# Patient Record
Sex: Female | Born: 1965 | Race: White | Hispanic: No | Marital: Married | Smoking: Never smoker
Health system: Southern US, Community
[De-identification: ages and names within clinical notes are randomized; demographics above are authoritative.]

## PROBLEM LIST (undated history)

## (undated) DIAGNOSIS — F319 Bipolar disorder, unspecified: Secondary | ICD-10-CM

## (undated) DIAGNOSIS — K769 Liver disease, unspecified: Secondary | ICD-10-CM

## (undated) DIAGNOSIS — F32A Depression, unspecified: Secondary | ICD-10-CM

## (undated) DIAGNOSIS — K219 Gastro-esophageal reflux disease without esophagitis: Secondary | ICD-10-CM

## (undated) DIAGNOSIS — F419 Anxiety disorder, unspecified: Secondary | ICD-10-CM

## (undated) DIAGNOSIS — T7840XA Allergy, unspecified, initial encounter: Secondary | ICD-10-CM

## (undated) DIAGNOSIS — E7800 Pure hypercholesterolemia, unspecified: Secondary | ICD-10-CM

## (undated) DIAGNOSIS — E119 Type 2 diabetes mellitus without complications: Secondary | ICD-10-CM

## (undated) DIAGNOSIS — N189 Chronic kidney disease, unspecified: Secondary | ICD-10-CM

## (undated) DIAGNOSIS — I10 Essential (primary) hypertension: Secondary | ICD-10-CM

## (undated) DIAGNOSIS — M1990 Unspecified osteoarthritis, unspecified site: Secondary | ICD-10-CM

## (undated) HISTORY — DX: Liver disease, unspecified: K76.9

## (undated) HISTORY — DX: Allergy, unspecified, initial encounter: T78.40XA

## (undated) HISTORY — DX: Type 2 diabetes mellitus without complications: E11.9

## (undated) HISTORY — DX: Pure hypercholesterolemia, unspecified: E78.00

## (undated) HISTORY — DX: Unspecified osteoarthritis, unspecified site: M19.90

## (undated) HISTORY — DX: Essential (primary) hypertension: I10

---

## 1987-01-14 HISTORY — PX: TUBAL LIGATION: SHX77

## 1988-01-14 HISTORY — PX: CHOLECYSTECTOMY: SHX55

## 2007-01-14 HISTORY — PX: KNEE ARTHROSCOPY: SUR90

## 2007-04-12 ENCOUNTER — Ambulatory Visit: Payer: Self-pay | Admitting: Cardiology

## 2007-04-15 ENCOUNTER — Ambulatory Visit: Payer: Self-pay

## 2007-04-19 ENCOUNTER — Ambulatory Visit: Payer: Self-pay

## 2007-04-21 ENCOUNTER — Ambulatory Visit (HOSPITAL_BASED_OUTPATIENT_CLINIC_OR_DEPARTMENT_OTHER): Admission: RE | Admit: 2007-04-21 | Discharge: 2007-04-21 | Payer: Self-pay | Admitting: Cardiology

## 2007-04-27 ENCOUNTER — Ambulatory Visit: Payer: Self-pay | Admitting: Pulmonary Disease

## 2007-06-03 ENCOUNTER — Ambulatory Visit: Payer: Self-pay | Admitting: Cardiology

## 2007-06-08 ENCOUNTER — Ambulatory Visit: Payer: Self-pay | Admitting: Pulmonary Disease

## 2007-06-08 DIAGNOSIS — F518 Other sleep disorders not due to a substance or known physiological condition: Secondary | ICD-10-CM

## 2007-06-08 DIAGNOSIS — J329 Chronic sinusitis, unspecified: Secondary | ICD-10-CM | POA: Insufficient documentation

## 2007-06-08 DIAGNOSIS — G4733 Obstructive sleep apnea (adult) (pediatric): Secondary | ICD-10-CM

## 2007-06-08 DIAGNOSIS — Z86718 Personal history of other venous thrombosis and embolism: Secondary | ICD-10-CM

## 2009-01-30 ENCOUNTER — Emergency Department (HOSPITAL_COMMUNITY): Admission: EM | Admit: 2009-01-30 | Discharge: 2009-01-31 | Payer: Self-pay | Admitting: Emergency Medicine

## 2009-01-31 ENCOUNTER — Inpatient Hospital Stay (HOSPITAL_COMMUNITY): Admission: AD | Admit: 2009-01-31 | Discharge: 2009-02-05 | Payer: Self-pay | Admitting: Psychiatry

## 2009-01-31 ENCOUNTER — Ambulatory Visit: Payer: Self-pay | Admitting: Psychiatry

## 2009-11-27 ENCOUNTER — Emergency Department (HOSPITAL_COMMUNITY): Admission: EM | Admit: 2009-11-27 | Discharge: 2009-11-27 | Payer: Self-pay | Admitting: Family Medicine

## 2010-02-04 ENCOUNTER — Emergency Department (HOSPITAL_COMMUNITY)
Admission: EM | Admit: 2010-02-04 | Discharge: 2010-02-04 | Payer: Self-pay | Source: Home / Self Care | Admitting: Emergency Medicine

## 2010-02-05 LAB — URINALYSIS, ROUTINE W REFLEX MICROSCOPIC
Bilirubin Urine: NEGATIVE
Hgb urine dipstick: NEGATIVE
Protein, ur: NEGATIVE mg/dL
Specific Gravity, Urine: 1.021 (ref 1.005–1.030)
Urine Glucose, Fasting: NEGATIVE mg/dL
Urobilinogen, UA: 0.2 mg/dL (ref 0.0–1.0)

## 2010-02-05 LAB — URINE MICROSCOPIC-ADD ON

## 2010-03-26 LAB — POCT RAPID STREP A (OFFICE): Streptococcus, Group A Screen (Direct): NEGATIVE

## 2010-04-01 LAB — DIFFERENTIAL
Basophils Absolute: 0 10*3/uL (ref 0.0–0.1)
Eosinophils Relative: 1 % (ref 0–5)
Monocytes Absolute: 0.3 10*3/uL (ref 0.1–1.0)
Monocytes Relative: 3 % (ref 3–12)
Neutrophils Relative %: 79 % — ABNORMAL HIGH (ref 43–77)

## 2010-04-01 LAB — URINALYSIS, ROUTINE W REFLEX MICROSCOPIC
Bilirubin Urine: NEGATIVE
Hgb urine dipstick: NEGATIVE
Specific Gravity, Urine: 1.01 (ref 1.005–1.030)
pH: 7 (ref 5.0–8.0)

## 2010-04-01 LAB — POCT PREGNANCY, URINE: Preg Test, Ur: NEGATIVE

## 2010-04-01 LAB — COMPREHENSIVE METABOLIC PANEL
ALT: 15 U/L (ref 0–35)
CO2: 26 mEq/L (ref 19–32)
Creatinine, Ser: 0.67 mg/dL (ref 0.4–1.2)
GFR calc non Af Amer: >60 mL/min (ref >60)
Total Bilirubin: 0.5 mg/dL (ref 0.3–1.2)
Total Protein: 7.6 g/dL (ref 6.0–8.3)

## 2010-04-01 LAB — RAPID URINE DRUG SCREEN, HOSP PERFORMED
Benzodiazepines: NOT DETECTED
Cocaine: NOT DETECTED
Opiates: NOT DETECTED

## 2010-04-01 LAB — ACETAMINOPHEN LEVEL: Acetaminophen (Tylenol), Serum: <10 ug/mL — ABNORMAL LOW (ref 10–30)

## 2010-04-01 LAB — CBC
HCT: 35.8 % — ABNORMAL LOW (ref 36.0–46.0)
Hemoglobin: 12.1 g/dL (ref 12.0–15.0)
MCHC: 33.7 g/dL (ref 30.0–36.0)
RBC: 4.18 MIL/uL (ref 3.87–5.11)
RDW: 12.9 % (ref 11.5–15.5)
WBC: 10.4 10*3/uL (ref 4.0–10.5)

## 2010-04-01 LAB — SALICYLATE LEVEL: Salicylate Lvl: <4 mg/dL (ref 2.8–20.0)

## 2010-04-01 LAB — ETHANOL: Alcohol, Ethyl (B): <5 mg/dL (ref 0–10)

## 2010-04-03 ENCOUNTER — Other Ambulatory Visit (HOSPITAL_COMMUNITY)
Admission: RE | Admit: 2010-04-03 | Discharge: 2010-04-03 | Disposition: A | Discharge status: Home / Self Care | Payer: Self-pay | Source: Ambulatory Visit | Attending: Family Medicine | Admitting: Family Medicine

## 2010-04-03 DIAGNOSIS — Z124 Encounter for screening for malignant neoplasm of cervix: Secondary | ICD-10-CM | POA: Insufficient documentation

## 2010-05-28 NOTE — Assessment & Plan Note (Signed)
[facility]                            CARDIOLOGY OFFICE NOTE   NAME:WRIGHTNocole, Zammit                         MRN:          [id number]  DATE:06/03/2007                            DOB:          [birth year]    PRIMARY:  Dorie Rank, PA, [facility], [address]   REASON FOR PRESENTATION:  Evaluate patient with chest pain.   HISTORY OF PRESENT ILLNESS:  The patient is a pleasant 45 year old who  presents for followup of the above.  Her symptoms are described  extensively in the March 30 note.  She was sent for stress perfusion  study which demonstrated an EF of 65% with no ischemia or infarct.  She  has since done well.  She is having her depression treated, starting  fluoxetine and trazodone.  She did have a sleep study demonstrating some  mild sleep apnea and she is due to see one of the sleep doctors in the  next few days.   She is still getting some fleeting chest discomfort, but she has been  doing [employer]ves now.  She with that is not getting any chest discomfort.  She has had some mild knee pain.  She is having shortness of breath  related to activity, but this seems to be appropriate for size and level  of deconditioning.  She is not having any resting shortness breath.  She  denies any PND or orthopnea.   PAST MEDICAL HISTORY:  1. Depression for 12 years.  2. Knee surgery.  3. Cholecystectomy.  4. Tubal ligation.  5. Probable mild sleep apnea.   ALLERGIES:  None.   MEDICATIONS:  1. Trazodone 50 mg nightly.  2. Fluoxetine 20 mg daily.  3. Multivitamin.   REVIEW OF SYSTEMS:  As stated in HPI and otherwise negative for other  systems.   PHYSICAL EXAMINATION:  The patient is in no distress.  Blood pressure 114/77, heart rate 88 and regular, weight 266 pounds  (down 13 pounds since the last visit).  NECK:  No jugular distention at 45 degrees; carotid upstroke brisk and  symmetrical; no bruits; no thyromegaly.  LYMPHATICS:  No  adenopathy.  LUNGS:  Clear to auscultation bilaterally.  BACK:  No costovertebral angle tenderness.  CHEST:  Unremarkable.  HEART:  PMI not displaced or sustained, S1 and S2 within normal, no S3,  no S4, no clicks, no rubs, no murmurs.  ABDOMEN:  Obese; positive bowel  sounds, normal in frequency and pitch; no bruits, no rebound, no  guarding, no midline pulsatile mass, no hepatomegaly, no splenomegaly.  SKIN:  No rashes, no nodules.  EXTREMITIES:  Pulses 2+, no edema.   ASSESSMENT AND PLAN:  1. Chest discomfort.  The patient has had still some infrequent chest      discomfort, but a negative stress perfusion study.  The possibility      of this being cardiac is very low.  No further cardiovascular      testing is suggested.  2. Sleep apnea.  She has an appointment with the sleep doctor.  3. Depression.  I am delighted this is  being treated and she says she      is doing much better with the fluoxetine.  4. Obesity.  She has lost weight.  She has joined Curves and I applaud      and encourage more of this.  5. Followup:  I will see the patient back as needed.     Rollene Rotunda, MD, [facility]  Electronically Signed    JH/MedQ  DD: 06/03/2007  DT: 06/03/2007  Job #: [id number]   cc:   Dorie Rank, P.A.

## 2010-05-28 NOTE — Assessment & Plan Note (Signed)
[facility]                            CARDIOLOGY OFFICE NOTE   NAME:Parrish, Sara                         MRN:          [id number]  DATE:04/12/2007                            DOB:          Jan 17, 1965    REFERRING:  Dorie Rank, MD, [facility], [facility] [address].   REASON FOR PRESENTATION:  Evaluate patient with chest pain.   HISTORY OF PRESENT ILLNESS:  The patient is a pleasant 45 year old with  no prior cardiac history.  She had chest discomfort on March 19.  This  happened after emotional stress.  She described it as a substernal left-  sided discomfort.  There was some in her axilla as well.  It was chest  tightening with a stabbing discomfort.  It lasted from 1:30 to 3:30.  She did present to [facility].  She had a chest CT, which she reports  demonstrated no pulmonary embolism (I do not have this report).  The  discomfort slowly eased over a couple of hours.  However, she still gets  it on and off with emotional stress or activity such as walking across  the parking lot at work.  She has no associated nausea or vomiting.  She  does get some shortness of breath with this.  She has not had any  diaphoresis.  She is not noticing it at rest.  She chronically sleeps on  three pillows because of difficulty breathing.  This is not a new  problem.  Her husband says she snores quite a bit.  She has been under  quite a bit of stress.  She is very tearful in the office.  She cares  for an ailing mother and a son with some disabilities.  She is being now  counseled for depression and is soon to see a new primary care doctor  possibly to start antidepressants.   PAST MEDICAL HISTORY:  Depression x12 years.  The patient has no history  of hyperlipidemia (she said she was at a health fair and told it was  borderline with a low HDL recently), she has no history of hypertension  (she does not check it frequently), she has not been told she had  diabetes.   PAST SURGICAL HISTORY:  Knee surgery, cholecystectomy, tubal ligation.   ALLERGIES:  None.   CURRENT MEDICATIONS:  Alprazolam p.r.n., Prilosec.   SOCIAL HISTORY:  The patient works in [employer].  She is married.  She has two children and two grandchildren.  She never smoked cigarettes  and does not drink alcohol.   FAMILY HISTORY:  Noncontributory for early coronary artery disease,  though there his a strong history of diabetes.   REVIEW OF SYSTEMS:  As stated in the HPI.  Positive for reflux not  similar to the current pain, joint pains.  Negative for other systems.   PHYSICAL EXAMINATION:  The patient is in no distress.  Blood pressure 155/96, heart rate 112 and regular, weight 279 pounds.  HEENT:  Eyelids unremarkable.  Pupils equal, round and reactive to  light, fundi within normal limits.  Oral mucosa unremarkable.  Tongue  notching.  NECK:  No jugular venous distention at 45 degrees.  Carotid upstroke  brisk and symmetrical.  No bruits, no thyromegaly.  LYMPHATICS:  No cervical, axillary or inguinal adenopathy.  LUNGS:  Clear to auscultation bilaterally.  BACK:  No costovertebral angle tenderness.  CHEST:  Unremarkable.  HEART:  PMI not displaced or sustained, S1 and S2 within normal limits,  no S3, no S4, no clicks, no rubs, no murmurs.  ABDOMEN:  Obese, positive bowel sounds, normal in frequency and pitch,  no bruits, no rebound, no guarding, no midline pulsatile mass, no  hepatomegaly, no splenomegaly.  SKIN:  No rashes, no nodules.  EXTREMITIES:  2+ pulses throughout, no edema, no cyanosis, no clubbing.  NEUROLOGIC:  Oriented to person, place and time.  Cranial nerves II-XII  grossly intact.  Motor grossly intact.   EKG (done at [facility] office):  Sinus rhythm, rate 100, axis within  normal limits, intervals within normal limits, no acute ST-wave changes.   ASSESSMENT/PLAN:  1. Chest discomfort:  The patient's chest discomfort  does have some      worrisome  features for unstable angina.  It is new-onset exertional      angina.  The pretest probability is at least moderate for      obstructive coronary artery disease.  Therefore, she needs stress      perfusion imaging.  I discussed this with her and she agrees to      proceed.  She should have this before she goes back to work.      Further evaluation will be based on these results.  2. Sleep apnea:  The patient almost definitely has sleep apnea.  She      has tongue notching, notching loud snoring and the body habitus.      She has severe fatigue.  We will schedule her for a sleep study.  3. Hypertension:  Blood pressure is elevated, though she does not      think this is usual.  I am going to ask her to keep a blood      pressure diary and she can borrow her mother's cuff.  Certainly if      she has sleep apnea, treating this will improve her blood pressure.      Also she is interested a weight loss.  However, in the interim she      may need some medical management.  She will let me know results of      her blood pressure diary.  4. Obesity:  We discussed weight loss with diet and exercise.  She      will be cleared to exercise based on the stress test.  We discussed      the [employer].  5. Risk reduction:  The patient recently had lipids, and this can be      followed by her primary care doctor.  6. Depression:  This is a very active issue.  Certainly fatigue and      sleep apnea could contribute.  However, she is going to get a new      primary care doctor and continue      counseling.  Hopefully, she will get management of this as it is a      very important issue.  7. Follow-up:  I will see her back after the results of the above.     Rollene Rotunda, MD, [facility]  Electronically Signed    JH/MedQ  DD:  04/12/2007  DT: 04/12/2007  Job #: [id number]   cc:   Dorie Rank, MD

## 2010-05-28 NOTE — Procedures (Signed)
NAME:  Sara Parrish, Sara Parrish NO.:  [id number]   MEDICAL RECORD NO.:  [id number]          PATIENT TYPE:  OUT   LOCATION:  SLEEP CENTER                 FACILITY:  [facility]   PHYSICIAN:  Barbaraann Share, MD,FCCPDATE OF BIRTH:  03/06/1963   DATE OF STUDY:  04/21/2007                            NOCTURNAL POLYSOMNOGRAM   REFERRING PHYSICIAN:  Rollene Rotunda, MD, [facility]   REFERRING PHYSICIAN:  Rollene Rotunda, MD, [facility].   INDICATION FOR STUDY:  Hypersomnia with sleep apnea.   EPWORTH SLEEPINESS SCORE:  9.   MEDICATIONS:   SLEEP ARCHITECTURE:  The patient had total sleep time of 257 minutes  with no slow wave sleep and significantly decreased REM.  Sleep onset  latency was prolonged at 64 minutes and REM onset was prolonged at 90  minutes.  Sleep efficiency was significantly decreased at 67%.   RESPIRATORY DATA:  The patient was found to have no obstructive apneas  and 33 obstructive hypopneas for an apnea hypopnea index of 8 events per  hour.  The events did occur primarily on the patient's left side and  also during REM, and there was loud snoring noted throughout.  The  patient did not need split night protocol secondary to the small numbers  of events until well after 1:00 a.m.   OXYGEN DATA:  There was O2 desaturation as low as 86% with the patient's  obstructive events.   CARDIAC DATA:  No clinically significant arrhythmias were noted.   MOVEMENT-PARASOMNIA:  The patient was found to have no significant leg  jerks or abnormal behaviors.   IMPRESSIONS-RECOMMENDATIONS:  Mild obstructive sleep apnea/hypopnea  syndrome with an apnea hypopnea index of 8 events per hour and O2  desaturation as low as 86%.  It should be noted that the patient's  events occurred very commonly during REM, and the patient was noted to  have very little slow wave sleep or REM during  the night.  Therefore, her degree of sleep apnea may be under estimated.  The patient did not undergo split  night study secondary to the majority  of her events occurring well after 1:00 a.m.      Barbaraann Share, MD,FCCP  Diplomate, American Board of Sleep  Medicine  Electronically Signed     [facility]/MEDQ  D:  04/27/2007 15:58:42  T:  04/27/2007 16:27:34  Job:  [id number]

## 2012-03-09 IMAGING — CT CT ABD-PELV W/O CM
2 of 4 series · 14 of 32 positions shown, 19 images · non-contrast
Comparison: None.

CLINICAL DATA: Left flank pain/history of kidney stones

CT ABDOMEN AND PELVIS WITHOUT CONTRAST
TECHNIQUE: Multidetector CT imaging of the abdomen and pelvis was
performed following the standard protocol without intravenous
contrast.

[Series 2: renal stone · axial · 0.76mm/px · z∈[-498,-193]mm · 8 of 79 slices shown, 13 images]
[im 9/79  soft-tissue]
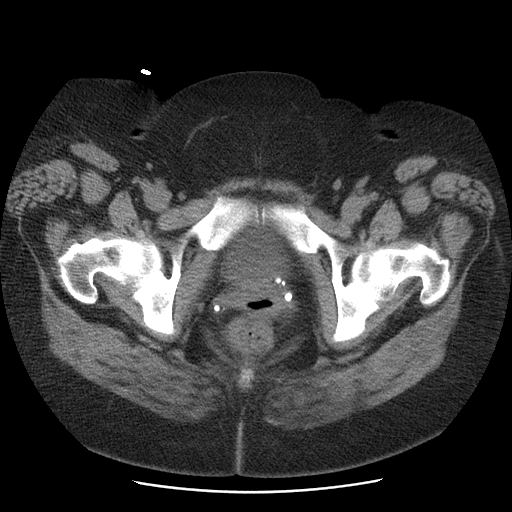
[im 9/79  bone]
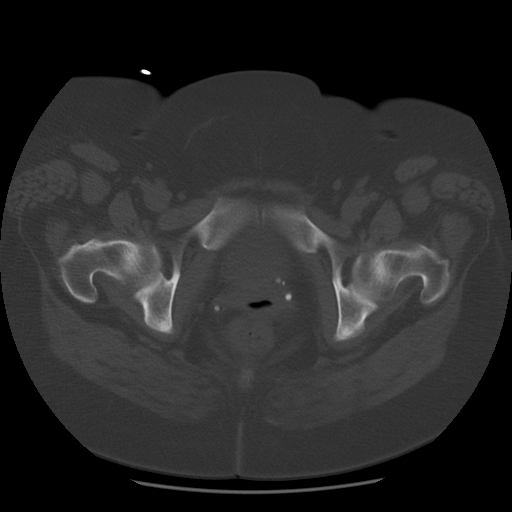
[im 18/79  soft-tissue]
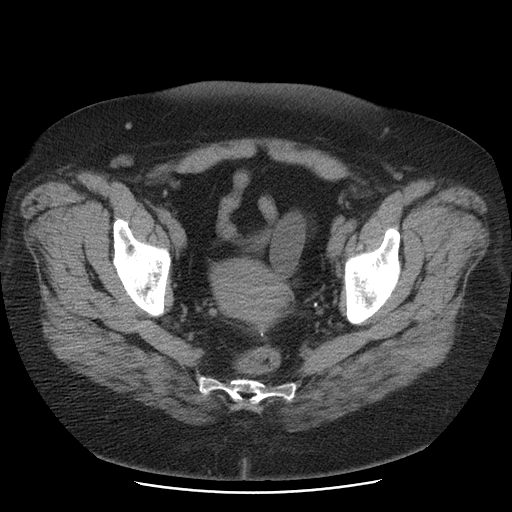
[im 27/79  soft-tissue]
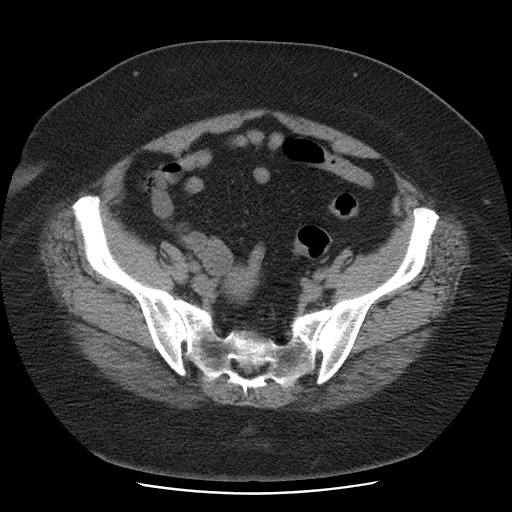
[im 35/79  soft-tissue]
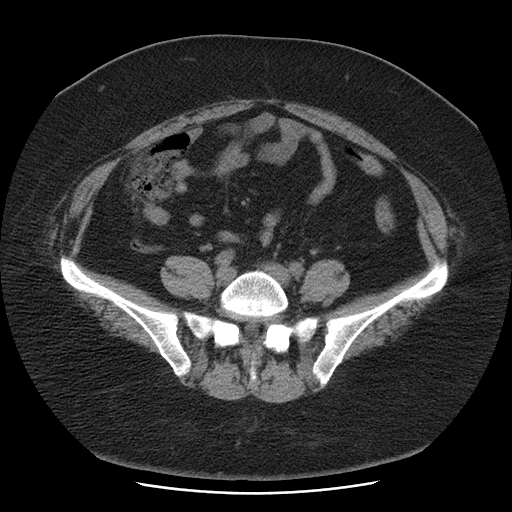
[im 44/79  soft-tissue]
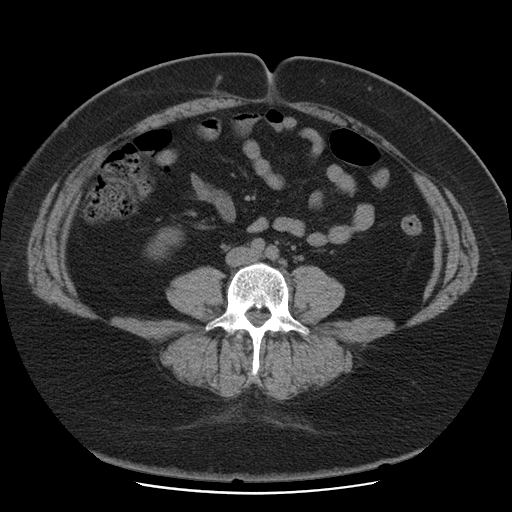
[im 44/79  lung]
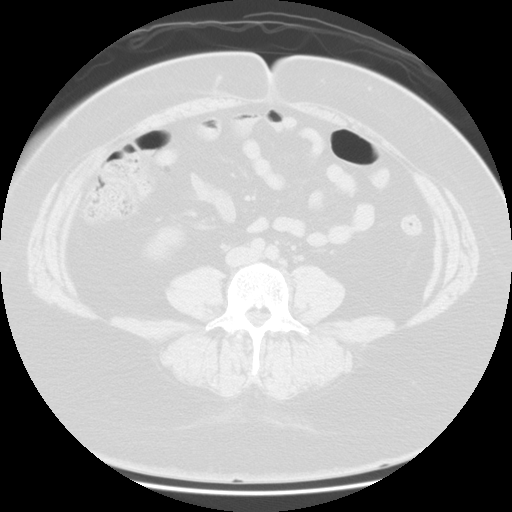
[im 53/79  soft-tissue]
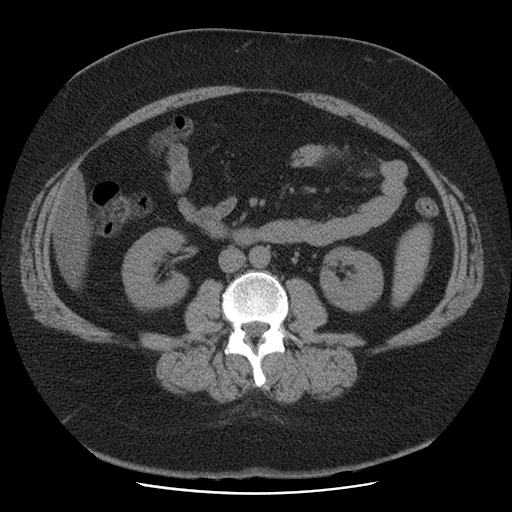
[im 53/79  lung]
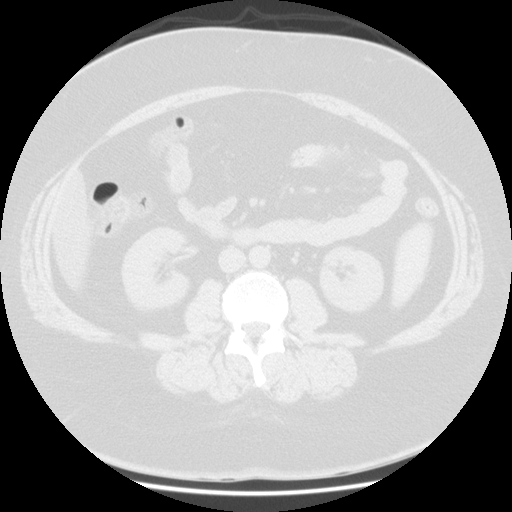
[im 61/79  soft-tissue]
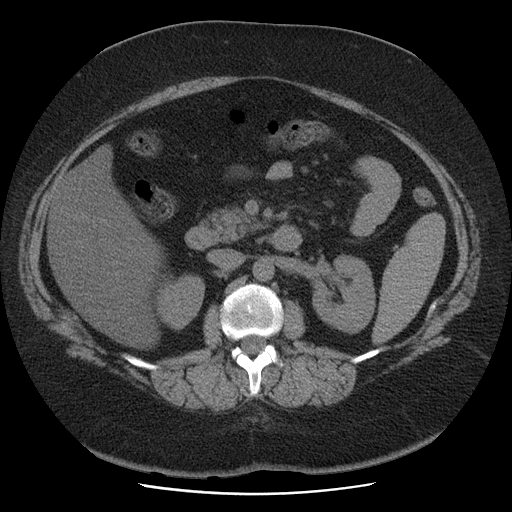
[im 61/79  lung]
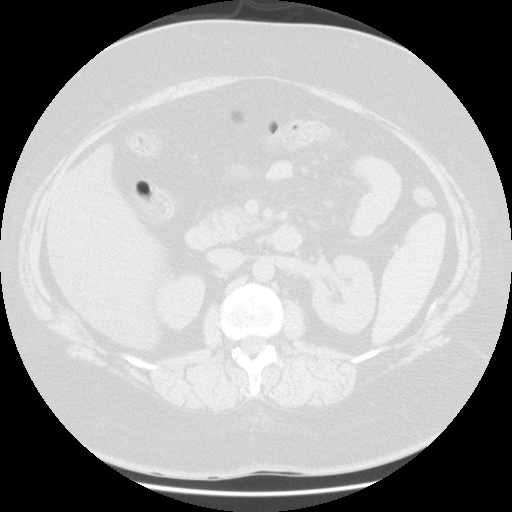
[im 70/79  soft-tissue]
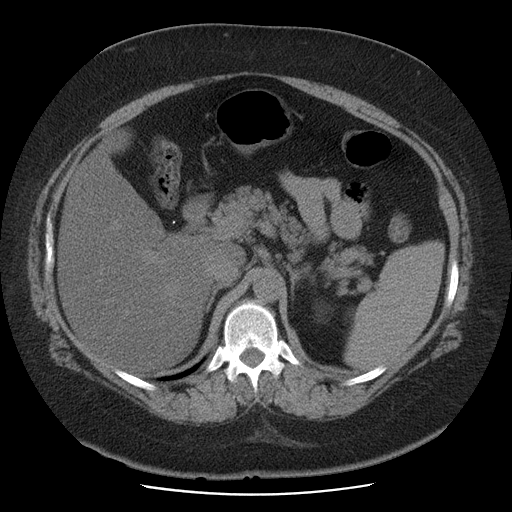
[im 70/79  lung]
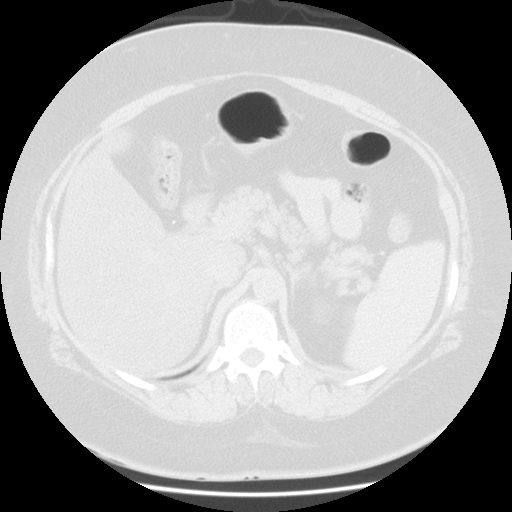

[Series 400: sagittal a/p · sagittal · 0.81mm/px · 6 of 103 slices shown]
[im 10/103  soft-tissue]
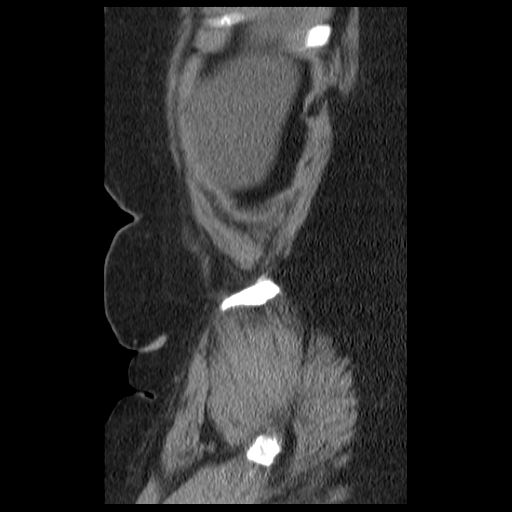
[im 19/103  soft-tissue]
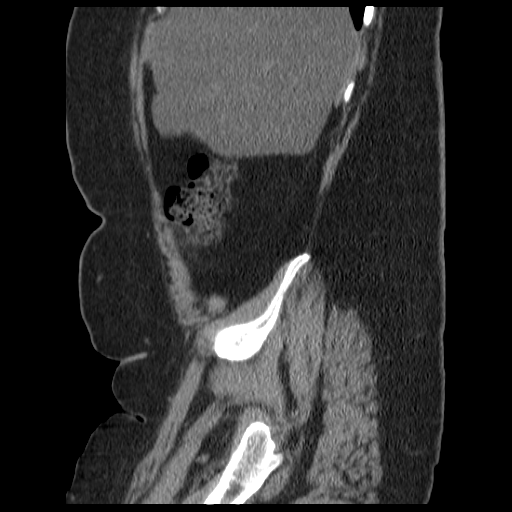
[im 38/103  soft-tissue]
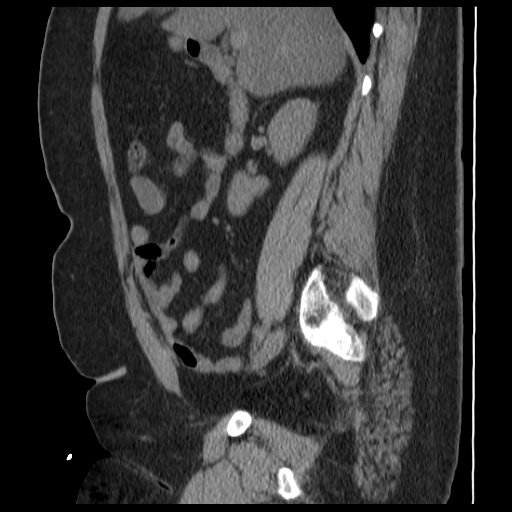
[im 47/103  soft-tissue]
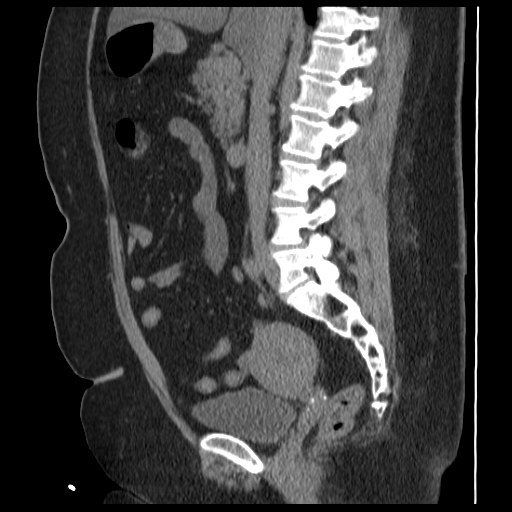
[im 56/103  soft-tissue]
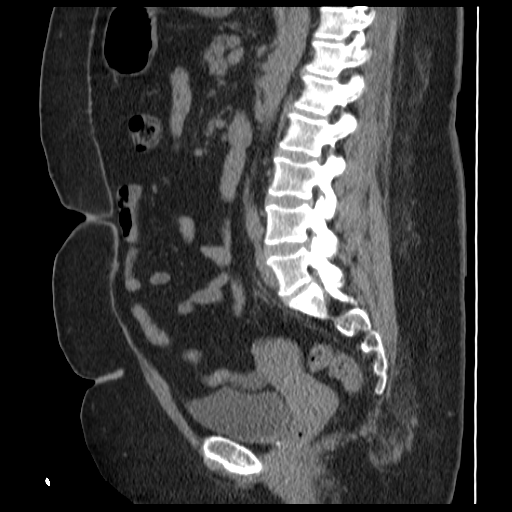
[im 65/103  soft-tissue]
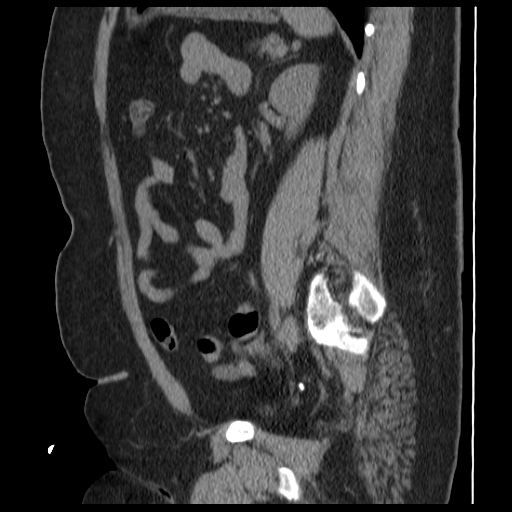

[14 of 32 positions shown; findings below may reference images not displayed]

FINDINGS: The lung bases are clear.  There is fatty infiltration of
the liver.  Prior cholecystectomy.  Spleen, pancreas, adrenal
glands, and kidneys unremarkable in the unenhanced state.
Specifically there are no renal calculi.  No obvious ureteral or
bladder calculi.

There is an ovoid cystic lesion in the left adnexa measuring
approximately 5.1 x 2.8 by 2.4 cm.  It has Hounsfield units in the
range of water.  This is likely a simple cyst of the left ovary.
It warrants followup.  I would recommend an ultrasound of the
pelvis in 4-6 weeks.  There is no free fluid in the pelvis or cul-
de-sac.  No other masses or fluid collections.  No inflammatory
changes of the large or small bowel.  Osseous structures intact.
IMPRESSION: 1.  No urinary tract calculi or obstruction.
2.  In the unenhanced state, no abnormality of the abdominal
viscera.  Hepatic steatosis is noted.

3.  There is an ovoid shaped cystic lesion in the left adnexa that
is probably a large, simple ovarian cyst, but it does warrant
followup.  See report.

## 2014-05-25 HISTORY — PX: COLONOSCOPY: SHX174

## 2016-01-28 ENCOUNTER — Encounter (INDEPENDENT_AMBULATORY_CARE_PROVIDER_SITE_OTHER): Payer: Self-pay | Admitting: Ophthalmology

## 2019-09-08 DIAGNOSIS — R000 Tachycardia, unspecified: Secondary | ICD-10-CM

## 2019-09-08 HISTORY — DX: Tachycardia, unspecified: R00.0

## 2020-09-26 ENCOUNTER — Other Ambulatory Visit: Payer: Self-pay

## 2020-09-26 ENCOUNTER — Encounter: Payer: Self-pay | Admitting: Physician Assistant

## 2020-09-26 ENCOUNTER — Ambulatory Visit (INDEPENDENT_AMBULATORY_CARE_PROVIDER_SITE_OTHER): Payer: BC Managed Care – PPO | Admitting: Physician Assistant

## 2020-09-26 DIAGNOSIS — D2261 Melanocytic nevi of right upper limb, including shoulder: Secondary | ICD-10-CM | POA: Diagnosis not present

## 2020-09-26 DIAGNOSIS — Z1283 Encounter for screening for malignant neoplasm of skin: Secondary | ICD-10-CM

## 2020-09-26 DIAGNOSIS — D485 Neoplasm of uncertain behavior of skin: Secondary | ICD-10-CM

## 2020-09-26 DIAGNOSIS — D224 Melanocytic nevi of scalp and neck: Secondary | ICD-10-CM

## 2020-09-26 DIAGNOSIS — B079 Viral wart, unspecified: Secondary | ICD-10-CM

## 2020-09-26 NOTE — Patient Instructions (Signed)

## 2020-09-26 NOTE — Progress Notes (Signed)
   New Patient   Subjective  Sara Parrish is a 55 y.o. female who presents for the following: New Patient (Initial Visit) (Patient here today for skin check. Per patient she has several lesions she would like checked on her left index finger, right thumb, left shoulder, right side of neck, right elbow and right buttock x 6 months. Per patient no bleeding, there is pain on the right elbow. No persona history of. Stage 3 liver disease per patient ). She has lost over 70 pounds.    The following portions of the chart were reviewed this encounter and updated as appropriate:  Tobacco  Allergies  Meds  Problems  Med Hx  Surg Hx  Fam Hx      Objective  Well appearing patient in no apparent distress; mood and affect are within normal limits.  A full examination was performed including scalp, head, eyes, ears, nose, lips, neck, chest, axillae, abdomen, back, buttocks, bilateral upper extremities, bilateral lower extremities, hands, feet, fingers, toes, fingernails, and toenails. All findings within normal limits unless otherwise noted below.  Right 2nd finger Verrucous papules -- Discussed viral etiology and contagion.   Right Elbow - Posterior White, dense plaque     Neck - Posterior Thick, crusty plaque      Assessment & Plan  Viral warts, unspecified type Right 2nd finger  If stable its safe to leave.  Destruction of lesion - Right 2nd finger Complexity: simple   Destruction method: cryotherapy   Informed consent: discussed and consent obtained   Timeout:  patient name, date of birth, surgical site, and procedure verified Lesion destroyed using liquid nitrogen: Yes   Cryotherapy cycles:  3 Outcome: patient tolerated procedure well with no complications   Post-procedure details: wound care instructions given    Neoplasm of uncertain behavior of skin (2) Right Elbow - Posterior  Skin / nail biopsy Type of biopsy: tangential   Informed consent: discussed and consent  obtained   Timeout: patient name, date of birth, surgical site, and procedure verified   Anesthesia: the lesion was anesthetized in a standard fashion   Anesthetic:  1% lidocaine w/ epinephrine 1-100,000 local infiltration Instrument used: flexible razor blade   Hemostasis achieved with: aluminum chloride and electrodesiccation   Outcome: patient tolerated procedure well   Post-procedure details: wound care instructions given    Specimen 1 - Surgical pathology Differential Diagnosis: CN  Check Margins: No  Neck - Posterior  Skin / nail biopsy Type of biopsy: tangential   Informed consent: discussed and consent obtained   Timeout: patient name, date of birth, surgical site, and procedure verified   Procedure prep:  Patient was prepped and draped in usual sterile fashion (Non sterile) Prep type:  Chlorhexidine Anesthesia: the lesion was anesthetized in a standard fashion   Anesthetic:  1% lidocaine w/ epinephrine 1-100,000 local infiltration Instrument used: flexible razor blade   Outcome: patient tolerated procedure well   Post-procedure details: wound care instructions given    Specimen 2 - Surgical pathology Differential Diagnosis: ISK  Check Margins: No     I, Wanetta Funderburke, PA-C, have reviewed all documentation's for this visit.  The documentation on 09/26/20 for the exam, diagnosis, procedures and orders are all accurate and complete.

## 2021-02-19 NOTE — Pre-Procedure Instructions (Signed)
Surgical Instructions    Your procedure is scheduled on Friday, February 17th.  Report to [facility] Main Entrance "A" at 12:30 P.M., then check in with the Admitting office.  Call this number if you have problems the morning of surgery:  [phone]   If you have any questions prior to your surgery date call [phone]: Open Monday-Friday 8am-4pm    Remember:  Do not eat after midnight the night before your surgery  You may drink clear liquids until 11:30 a.m. the morning of your surgery.   Clear liquids allowed are: Water, Non-Citrus Juices (without pulp), Carbonated Beverages, Clear Tea, Black Coffee ONLY (NO MILK, CREAM OR POWDERED CREAMER of any kind), and Gatorade.   Enhanced Recovery after Surgery for Orthopedics Enhanced Recovery after Surgery is a protocol used to improve the stress on your body and your recovery after surgery.  The day of surgery (if you have diabetes):  Drink ONE small 12 oz bottle of Gatorade G2 by 11:30 am the morning of surgery This bottle was given to you during your hospital  pre-op appointment visit.  Nothing else to drink after completing the  Small 12 oz bottle of Gatorade G2.         If you have questions, please contact your surgeons office.     Take these medicines the morning of surgery with A SIP OF WATER:  carvedilol (COREG)  cloNIDine HCl  diltiazem (CARDIZEM CD)  DULoxetine (CYMBALTA)  lamoTRIgine (LAMICTAL)  LINZESS omeprazole (PRILOSEC)  topiramate (TOPAMAX) hydrOXYzine (ATARAX/VISTARIL)-as needed  Follow your surgeon's instructions on when to stop Aspirin.  If no instructions were given by your surgeon then you will need to call the office to get those instructions.    7 days prior to surgery, STOP taking any Aspirin (unless otherwise instructed by your surgeon) Aleve, Naproxen, Ibuprofen, Motrin, Advil, Goody's, BC's, all herbal medications, fish oil, and all vitamins.  WHAT DO I DO ABOUT MY DIABETES  MEDICATION?   Do not take metFORMIN (GLUCOPHAGE) the morning of surgery.  The day of surgery, do not take other diabetes injectables, including OZEMPIC.    HOW TO MANAGE YOUR DIABETES BEFORE AND AFTER SURGERY  Why is it important to control my blood sugar before and after surgery? Improving blood sugar levels before and after surgery helps healing and can limit problems. A way of improving blood sugar control is eating a healthy diet by:  Eating less sugar and carbohydrates  Increasing activity/exercise  Talking with your doctor about reaching your blood sugar goals High blood sugars (greater than 180 mg/dL) can raise your risk of infections and slow your recovery, so you will need to focus on controlling your diabetes during the weeks before surgery. Make sure that the doctor who takes care of your diabetes knows about your planned surgery including the date and location.  How do I manage my blood sugar before surgery? Check your blood sugar at least 4 times a day, starting 2 days before surgery, to make sure that the level is not too high or low.  Check your blood sugar the morning of your surgery when you wake up and every 2 hours until you get to the Short Stay unit.  If your blood sugar is less than 70 mg/dL, you will need to treat for low blood sugar: Do not take insulin. Treat a low blood sugar (less than 70 mg/dL) with  cup of clear juice (cranberry or apple), 4 glucose tablets, OR glucose gel. Recheck blood sugar in  15 minutes after treatment (to make sure it is greater than 70 mg/dL). If your blood sugar is not greater than 70 mg/dL on recheck, call [phone] for further instructions. Report your blood sugar to the short stay nurse when you get to Short Stay.  If you are admitted to the hospital after surgery: Your blood sugar will be checked by the staff and you will probably be given insulin after surgery (instead of oral diabetes medicines) to make sure you have good  blood sugar levels. The goal for blood sugar control after surgery is 80-180 mg/dL.            Do not wear jewelry or makeup Do not wear lotions, powders, perfumes, or deodorant. Do not shave 48 hours prior to surgery.  Do not bring valuables to the hospital. Do not wear nail polish, gel polish, artificial nails, or any other type of covering on natural nails (fingers and toes) If you have artificial nails or gel coating that need to be removed by a nail salon, please have this removed prior to surgery. Artificial nails or gel coating may interfere with anesthesia's ability to adequately monitor your vital signs.  [facility] is not responsible for any belongings or valuables. .   Do NOT Smoke (Tobacco/Vaping)  24 hours prior to your procedure  If you use a CPAP at night, you may bring your mask for your overnight stay.   Contacts, glasses, hearing aids, dentures or partials may not be worn into surgery, please bring cases for these belongings   For patients admitted to the hospital, discharge time will be determined by your treatment team.   Patients discharged the day of surgery will not be allowed to drive home, and someone needs to stay with them for 24 hours.  NO VISITORS WILL BE ALLOWED IN PRE-OP WHERE PATIENTS ARE PREPPED FOR SURGERY.  ONLY 1 SUPPORT PERSON MAY BE PRESENT IN THE WAITING ROOM WHILE YOU ARE IN SURGERY.  IF YOU ARE TO BE ADMITTED, ONCE YOU ARE IN YOUR ROOM YOU WILL BE ALLOWED TWO (2) VISITORS. 1 (ONE) VISITOR MAY STAY OVERNIGHT BUT MUST ARRIVE TO THE ROOM BY 8pm.  Minor children may have two parents present. Special consideration for safety and communication needs will be reviewed on a case by case basis.  Special instructions:    Oral Hygiene is also important to reduce your risk of infection.  Remember - BRUSH YOUR TEETH THE MORNING OF SURGERY WITH YOUR REGULAR TOOTHPASTE   [facility]- Preparing For Surgery  Before surgery, you can play an important role.  Because skin is not sterile, your skin needs to be as free of germs as possible. You can reduce the number of germs on your skin by washing with CHG (chlorahexidine gluconate) Soap before surgery.  CHG is an antiseptic cleaner which kills germs and bonds with the skin to continue killing germs even after washing.     Please do not use if you have an allergy to CHG or antibacterial soaps. If your skin becomes reddened/irritated stop using the CHG.  Do not shave (including legs and underarms) for at least 48 hours prior to first CHG shower. It is OK to shave your face.  Please follow these instructions carefully.     Shower the NIGHT BEFORE SURGERY and the MORNING OF SURGERY with CHG Soap.   If you chose to wash your hair, wash your hair first as usual with your normal shampoo. After you shampoo, rinse your hair and  body thoroughly to remove the shampoo.  Then [employer] and genitals (private parts) with your normal soap and rinse thoroughly to remove soap.  After that Use CHG Soap as you would any other liquid soap. You can apply CHG directly to the skin and wash gently with a scrungie or a clean washcloth.   Apply the CHG Soap to your body ONLY FROM THE NECK DOWN.  Do not use on open wounds or open sores. Avoid contact with your eyes, ears, mouth and genitals (private parts). Wash Face and genitals (private parts)  with your normal soap.   Wash thoroughly, paying special attention to the area where your surgery will be performed.  Thoroughly rinse your body with warm water from the neck down.  DO NOT shower/wash with your normal soap after using and rinsing off the CHG Soap.  Pat yourself dry with a CLEAN TOWEL.  Wear CLEAN PAJAMAS to bed the night before surgery  Place CLEAN SHEETS on your bed the night before your surgery  DO NOT SLEEP WITH PETS.   Day of Surgery:  Take a shower with CHG soap. Wear Clean/Comfortable clothing the morning of surgery Do not apply any  deodorants/lotions.   Remember to brush your teeth WITH YOUR REGULAR TOOTHPASTE.   Please read over the following fact sheets that you were given.

## 2021-02-20 ENCOUNTER — Other Ambulatory Visit: Payer: Self-pay

## 2021-02-20 ENCOUNTER — Encounter (HOSPITAL_COMMUNITY)
Admission: RE | Admit: 2021-02-20 | Discharge: 2021-02-20 | Disposition: A | Discharge status: Home / Self Care | Payer: BC Managed Care – PPO | Source: Ambulatory Visit | Attending: Orthopedic Surgery | Admitting: Orthopedic Surgery

## 2021-02-20 ENCOUNTER — Encounter (HOSPITAL_COMMUNITY): Payer: Self-pay

## 2021-02-20 VITALS — BP 116/85 | HR 85 | Temp 97.5°F | Resp 18 | Ht 67.0 in | Wt 198.5 lb

## 2021-02-20 DIAGNOSIS — Z01818 Encounter for other preprocedural examination: Secondary | ICD-10-CM | POA: Diagnosis not present

## 2021-02-20 DIAGNOSIS — E119 Type 2 diabetes mellitus without complications: Secondary | ICD-10-CM | POA: Diagnosis not present

## 2021-02-20 DIAGNOSIS — K769 Liver disease, unspecified: Secondary | ICD-10-CM | POA: Insufficient documentation

## 2021-02-20 HISTORY — DX: Chronic kidney disease, unspecified: N18.9

## 2021-02-20 HISTORY — DX: Depression, unspecified: F32.A

## 2021-02-20 HISTORY — DX: Bipolar disorder, unspecified: F31.9

## 2021-02-20 HISTORY — DX: Gastro-esophageal reflux disease without esophagitis: K21.9

## 2021-02-20 LAB — COMPREHENSIVE METABOLIC PANEL
ALT: 17 U/L (ref 0–44)
AST: 21 U/L (ref 15–41)
Albumin: 3.9 g/dL (ref 3.5–5.0)
Alkaline Phosphatase: 51 U/L (ref 38–126)
Anion gap: 9 (ref 5–15)
BUN: 14 mg/dL (ref 6–20)
CO2: 26 mmol/L (ref 22–32)
Calcium: 9.8 mg/dL (ref 8.9–10.3)
Chloride: 103 mmol/L (ref 98–111)
Creatinine, Ser: 1.04 mg/dL — ABNORMAL HIGH (ref 0.44–1.00)
GFR, Estimated: >60 mL/min (ref >60)
Glucose, Bld: 94 mg/dL (ref 70–99)
Potassium: 4.3 mmol/L (ref 3.5–5.1)
Sodium: 138 mmol/L (ref 135–145)
Total Bilirubin: 0.5 mg/dL (ref 0.3–1.2)
Total Protein: 7.5 g/dL (ref 6.5–8.1)

## 2021-02-20 LAB — GLUCOSE, CAPILLARY: Glucose-Capillary: 99 mg/dL (ref 70–99)

## 2021-02-20 LAB — CBC
HCT: 40.1 % (ref 36.0–46.0)
Hemoglobin: 12.6 g/dL (ref 12.0–15.0)
MCH: 25.7 pg — ABNORMAL LOW (ref 26.0–34.0)
MCHC: 31.4 g/dL (ref 30.0–36.0)
MCV: 81.8 fL (ref 80.0–100.0)
Platelets: 351 10*3/uL (ref 150–400)
RBC: 4.9 MIL/uL (ref 3.87–5.11)
RDW: 13.2 % (ref 11.5–15.5)
WBC: 12.4 10*3/uL — ABNORMAL HIGH (ref 4.0–10.5)
nRBC: 0 % (ref 0.0–0.2)

## 2021-02-20 LAB — HEMOGLOBIN A1C
Hgb A1c MFr Bld: 4.9 % (ref 4.8–5.6)
Mean Plasma Glucose: 93.93 mg/dL

## 2021-02-20 NOTE — Progress Notes (Signed)
Anesthesia Chart Review:  Patient follows with cardiology at [facility] clinic for history of tachycardia.  Nuclear stress 09/08/2019 was low risk, nonischemic.  Echo 02/01/2020 showed EF 60 to 65%, grade 1 DD, no significant valvular abnormalities.  She was last seen 01/14/2021 and at that time tachycardia was noted to be under better control on Cardizem and carvedilol.  She was also cleared for surgery at that time.  Per note, "preoperative exam: May proceed with surgical procedure without further cardiac testing, benefit outweighs risks.  Low perioperative risk.  No active major cardiac disease (recent MI, decompensated CHF, uncontrolled arrhythmias, or severe valvular disease).  Continue current medication, and perioperative monitoring."  Patient had telehealth appointment with PCP 02/19/2021 for cough, congestion, sinus pain  She was diagnosed with acute sinusitis and prescribed Z-Pak. I Called and spoke with the patient on 02/26/21 to followup on this. She advised she had complete resolution of symptoms with Zpak. Denies any cough, congestion, or any other symptoms of illness.   History of abnormal liver enzymes since early 2020. 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 LFTs normal on preop labs.  Preop labs reviewed, unremarkable.  DM2 well-controlled, A1c 4.9.  EKG 02/20/2021: Normal sinus rhythm.  Rate 82. Low voltage QRS. Septal infarct , age undetermined  TTE 02/01/2020 (results in cardiology office visit note 01/14/2021, copy on chart): 1.  Overall left ventricular ejection fraction is estimated at 60 to 65%. 2.  Normal global left ventricular systolic function. 3.  (Grade 1) mildly abnormal left ventricular diastolic filling. 4.  Mild mitral annular calcification.  Stress test 09/08/2019 (results in cardiology office visit note 01/14/2021, copy on chart): Impression: SPECT imaging shows a normal study.  EF is normal at 60%.  Marked anterior wall breast attenuation  artifact.  No transient ischemic dilation (0.92).  No extracardiac tracer uptake.    Wynonia Musty [facility] Short Stay Center/Anesthesiology Phone [phone] 02/26/2021 3:38 PM

## 2021-02-20 NOTE — Progress Notes (Signed)
PCP - Dr. Cathie Olden [facility], [place], [place]) Cardiologist - Dr. Marcille Blanco [facility])  PPM/ICD - denies   Chest x-ray - 12/18/20 (CE) EKG - 02/20/21 at PAT Stress Test - 09/08/19 ECHO - 02/01/20 Cardiac Cath - denies  Sleep Study - denies  DM- Type 2 Fasting Blood Sugar - 90-110 Checks Blood Sugar once a day  Blood Thinner Instructions: n/a Aspirin Instructions: ASA held since 2/5 per order  ERAS Protcol - yes PRE-SURGERY G2- given at PAT  COVID TEST- n/a, ambulatory surgery   Anesthesia review: yes, cardiac hx. Records requested from [facility], [place], [place]  Patient denies shortness of breath, fever, cough and chest pain at PAT appointment   All instructions explained to the patient, with a verbal understanding of the material. Patient agrees to go over the instructions while at home for a better understanding. Patient also instructed to wear a mask in public for 3 days prior to surgery. The opportunity to ask questions was provided.

## 2021-02-21 ENCOUNTER — Encounter (HOSPITAL_COMMUNITY): Payer: Self-pay

## 2021-02-26 NOTE — Anesthesia Preprocedure Evaluation (Addendum)
Anesthesia Evaluation  Patient identified by MRN, date of birth, ID band Patient awake    Reviewed: Allergy & Precautions, NPO status , Patient's Chart, lab work & pertinent test results, reviewed documented beta blocker date and time   Airway Mallampati: II  TM Distance: >3 FB Neck ROM: Full    Dental no notable dental hx. 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 on medications and Pt. on home beta blockers Normal cardiovascular exam Rhythm:Regular Rate:Normal     Neuro/Psych PSYCHIATRIC DISORDERS Depression Bipolar Disorder negative neurological ROS     GI/Hepatic Neg liver ROS, GERD  Medicated and Controlled,  Endo/Other  diabetes, Well Controlled, Type 2, Oral Hypoglycemic AgentsObesity Hyperlipidemia  Renal/GU Renal InsufficiencyRenal disease  negative genitourinary   Musculoskeletal  (+) Arthritis , Osteoarthritis,  Torn Rotator Cuff right shoulder Torn Biceps Impingement syndrome right shoulder   Abdominal (+) + obese,   Peds  Hematology negative hematology ROS (+)   Anesthesia Other Findings   Reproductive/Obstetrics                            Anesthesia Physical Anesthesia Plan  ASA: 2  Anesthesia Plan: General and Regional   Post-op Pain Management: Regional block*   Induction: Intravenous  PONV Risk Score and Plan: 4 or greater and Treatment may vary due to age or medical condition, Midazolam, Ondansetron and Dexamethasone  Airway Management Planned: Oral ETT  Additional Equipment:   Intra-op Plan:   Post-operative Plan: Extubation in OR  Informed Consent: I have reviewed the patients History and Physical, chart, labs and discussed the procedure including  the risks, benefits and alternatives for the proposed anesthesia with the patient or authorized representative who has indicated his/her understanding and acceptance.     Dental advisory given  Plan Discussed with: CRNA and Anesthesiologist  Anesthesia Plan Comments: (PAT note by Karoline Caldwell, PA-C:  Patient follows with cardiology at [facility] clinic for history of tachycardia.  Nuclear stress 09/08/2019 was low risk, nonischemic.  Echo 02/01/2020 showed EF 60 to 65%, grade 1 DD, no significant valvular abnormalities.  She was last seen 01/14/2021 and at that time tachycardia was noted to be under better control on Cardizem and carvedilol.  She was also cleared for surgery at that time.  Per note, "preoperative exam: May proceed with surgical procedure without further cardiac testing, benefit outweighs risks.  Low perioperative risk.  No active major cardiac disease (recent MI, decompensated CHF, uncontrolled arrhythmias, or severe valvular disease).  Continue current medication, and perioperative monitoring."  Patient had telehealth appointment with PCP 02/19/2021 for cough, congestion, sinus pain  She was diagnosed with acute sinusitis and prescribed Z-Pak. I Called and spoke with the patient on 02/26/21 to followup on this. She advised she had complete resolution of symptoms with Zpak. Denies any cough, congestion, or any other symptoms of illness.   History of abnormal liver enzymes since early 2020. 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 LFTs normal on preop labs.  Preop labs reviewed, unremarkable.  DM2 well-controlled, A1c 4.9.  EKG 02/20/2021: Normal sinus rhythm.  Rate 82. Low voltage QRS. Septal infarct , age undetermined  TTE 02/01/2020 (results in cardiology office visit note 01/14/2021, copy on chart):  1.  Overall left ventricular ejection fraction is estimated at 60 to 65%. 2.  Normal global left ventricular systolic function. 3.  (Grade 1) mildly abnormal  left ventricular diastolic filling. 4.  Mild mitral annular calcification.  Stress test 09/08/2019 (results in cardiology office visit note 01/14/2021, copy on chart): Impression: SPECT imaging shows a normal study.  EF is normal at 60%.  Marked anterior wall breast attenuation artifact.  No transient ischemic dilation (0.92).  No extracardiac tracer uptake. )      Anesthesia Quick Evaluation

## 2021-03-01 ENCOUNTER — Ambulatory Visit (HOSPITAL_COMMUNITY): Payer: BC Managed Care – PPO | Admitting: Physician Assistant

## 2021-03-01 ENCOUNTER — Ambulatory Visit (HOSPITAL_COMMUNITY)
Admission: RE | Admit: 2021-03-01 | Discharge: 2021-03-01 | Disposition: A | Discharge status: Home / Self Care | Payer: BC Managed Care – PPO | Source: Ambulatory Visit | Attending: Orthopedic Surgery | Admitting: Orthopedic Surgery

## 2021-03-01 ENCOUNTER — Encounter (HOSPITAL_COMMUNITY): Payer: Self-pay | Admitting: Orthopedic Surgery

## 2021-03-01 ENCOUNTER — Ambulatory Visit (HOSPITAL_COMMUNITY): Payer: BC Managed Care – PPO | Admitting: Anesthesiology

## 2021-03-01 ENCOUNTER — Other Ambulatory Visit: Payer: Self-pay

## 2021-03-01 ENCOUNTER — Encounter (HOSPITAL_COMMUNITY)
Admission: RE | Disposition: A | Discharge status: Home / Self Care | Payer: Self-pay | Source: Ambulatory Visit | Attending: Orthopedic Surgery

## 2021-03-01 DIAGNOSIS — M19011 Primary osteoarthritis, right shoulder: Secondary | ICD-10-CM | POA: Insufficient documentation

## 2021-03-01 DIAGNOSIS — X58XXXA Exposure to other specified factors, initial encounter: Secondary | ICD-10-CM | POA: Insufficient documentation

## 2021-03-01 DIAGNOSIS — E669 Obesity, unspecified: Secondary | ICD-10-CM | POA: Insufficient documentation

## 2021-03-01 DIAGNOSIS — E1122 Type 2 diabetes mellitus with diabetic chronic kidney disease: Secondary | ICD-10-CM | POA: Diagnosis not present

## 2021-03-01 DIAGNOSIS — I129 Hypertensive chronic kidney disease with stage 1 through stage 4 chronic kidney disease, or unspecified chronic kidney disease: Secondary | ICD-10-CM | POA: Insufficient documentation

## 2021-03-01 DIAGNOSIS — S43431A Superior glenoid labrum lesion of right shoulder, initial encounter: Secondary | ICD-10-CM | POA: Diagnosis not present

## 2021-03-01 DIAGNOSIS — N189 Chronic kidney disease, unspecified: Secondary | ICD-10-CM | POA: Diagnosis not present

## 2021-03-01 DIAGNOSIS — Z6830 Body mass index (BMI) 30.0-30.9, adult: Secondary | ICD-10-CM | POA: Insufficient documentation

## 2021-03-01 DIAGNOSIS — F319 Bipolar disorder, unspecified: Secondary | ICD-10-CM | POA: Insufficient documentation

## 2021-03-01 DIAGNOSIS — M7521 Bicipital tendinitis, right shoulder: Secondary | ICD-10-CM | POA: Insufficient documentation

## 2021-03-01 DIAGNOSIS — M75121 Complete rotator cuff tear or rupture of right shoulder, not specified as traumatic: Secondary | ICD-10-CM | POA: Insufficient documentation

## 2021-03-01 DIAGNOSIS — K219 Gastro-esophageal reflux disease without esophagitis: Secondary | ICD-10-CM | POA: Diagnosis not present

## 2021-03-01 DIAGNOSIS — E785 Hyperlipidemia, unspecified: Secondary | ICD-10-CM | POA: Insufficient documentation

## 2021-03-01 HISTORY — PX: SHOULDER ARTHROSCOPY WITH ROTATOR CUFF REPAIR: SHX5685

## 2021-03-01 LAB — GLUCOSE, CAPILLARY
Glucose-Capillary: 124 mg/dL — ABNORMAL HIGH (ref 70–99)
Glucose-Capillary: 129 mg/dL — ABNORMAL HIGH (ref 70–99)
Glucose-Capillary: 126 mg/dL — ABNORMAL HIGH (ref 70–99)
Glucose-Capillary: 140 mg/dL — ABNORMAL HIGH (ref 70–99)

## 2021-03-01 SURGERY — ARTHROSCOPY, SHOULDER, WITH ROTATOR CUFF REPAIR
Anesthesia: Regional | Site: Shoulder | Laterality: Right

## 2021-03-01 MED ORDER — CHLORHEXIDINE GLUCONATE 0.12 % MT SOLN
15.0000 mL | OROMUCOSAL | Status: AC
  Administered 2021-03-01: 15 mL via OROMUCOSAL
  Filled 2021-03-01: qty 15

## 2021-03-01 MED ORDER — LIDOCAINE 2% (20 MG/ML) 5 ML SYRINGE
INTRAMUSCULAR | Status: AC
  Filled 2021-03-01: qty 5

## 2021-03-01 MED ORDER — OXYCODONE HCL 5 MG/5ML PO SOLN: 5.0000 mg | Freq: Once | ORAL | Status: DC | PRN

## 2021-03-01 MED ORDER — ORAL CARE MOUTH RINSE: 15.0000 mL | Freq: Once | OROMUCOSAL | Status: DC

## 2021-03-01 MED ORDER — ACETAMINOPHEN 10 MG/ML IV SOLN: 1000.0000 mg | Freq: Once | INTRAVENOUS | Status: DC | PRN

## 2021-03-01 MED ORDER — SUCCINYLCHOLINE CHLORIDE 200 MG/10ML IV SOSY
PREFILLED_SYRINGE | INTRAVENOUS | Status: DC | PRN
  Administered 2021-03-01: 160 mg via INTRAVENOUS

## 2021-03-01 MED ORDER — KETOROLAC TROMETHAMINE 30 MG/ML IJ SOLN: 30.0000 mg | Freq: Once | INTRAMUSCULAR | Status: DC

## 2021-03-01 MED ORDER — AMISULPRIDE (ANTIEMETIC) 5 MG/2ML IV SOLN: 10.0000 mg | Freq: Once | INTRAVENOUS | Status: DC | PRN

## 2021-03-01 MED ORDER — FENTANYL CITRATE (PF) 250 MCG/5ML IJ SOLN
INTRAMUSCULAR | Status: AC
  Filled 2021-03-01: qty 5

## 2021-03-01 MED ORDER — DEXAMETHASONE SODIUM PHOSPHATE 10 MG/ML IJ SOLN
INTRAMUSCULAR | Status: AC
  Filled 2021-03-01: qty 1

## 2021-03-01 MED ORDER — DEXAMETHASONE SODIUM PHOSPHATE 10 MG/ML IJ SOLN
INTRAMUSCULAR | Status: DC | PRN
  Administered 2021-03-01: 4 mg via INTRAVENOUS

## 2021-03-01 MED ORDER — 0.9 % SODIUM CHLORIDE (POUR BTL) OPTIME
TOPICAL | Status: DC | PRN
  Administered 2021-03-01: 1000 mL

## 2021-03-01 MED ORDER — LACTATED RINGERS IV SOLN: INTRAVENOUS | Status: DC

## 2021-03-01 MED ORDER — BUPIVACAINE LIPOSOME 1.3 % IJ SUSP
INTRAMUSCULAR | Status: DC | PRN
  Administered 2021-03-01: 10 mL via PERINEURAL

## 2021-03-01 MED ORDER — CHLORHEXIDINE GLUCONATE 0.12 % MT SOLN: 15.0000 mL | Freq: Once | OROMUCOSAL | Status: DC

## 2021-03-01 MED ORDER — INSULIN ASPART 100 UNIT/ML IJ SOLN: 0.0000 [IU] | INTRAMUSCULAR | Status: DC | PRN

## 2021-03-01 MED ORDER — PHENYLEPHRINE 40 MCG/ML (10ML) SYRINGE FOR IV PUSH (FOR BLOOD PRESSURE SUPPORT)
PREFILLED_SYRINGE | INTRAVENOUS | Status: DC | PRN
Start: 2021-03-01 — End: 2021-03-01
  Administered 2021-03-01: 40 ug via INTRAVENOUS
  Administered 2021-03-01: 80 ug via INTRAVENOUS

## 2021-03-01 MED ORDER — ROCURONIUM BROMIDE 10 MG/ML (PF) SYRINGE
PREFILLED_SYRINGE | INTRAVENOUS | Status: DC | PRN
Start: 2021-03-01 — End: 2021-03-01
  Administered 2021-03-01: 40 mg via INTRAVENOUS

## 2021-03-01 MED ORDER — FENTANYL CITRATE (PF) 100 MCG/2ML IJ SOLN: 25.0000 ug | INTRAMUSCULAR | Status: DC | PRN

## 2021-03-01 MED ORDER — OXYCODONE HCL 5 MG PO TABS: 5.0000 mg | ORAL_TABLET | Freq: Once | ORAL | Status: DC | PRN

## 2021-03-01 MED ORDER — EPINEPHRINE PF 1 MG/ML IJ SOLN
INTRAMUSCULAR | Status: AC
  Filled 2021-03-01: qty 2

## 2021-03-01 MED ORDER — FENTANYL CITRATE (PF) 100 MCG/2ML IJ SOLN: 50.0000 ug | Freq: Once | INTRAMUSCULAR | Status: AC

## 2021-03-01 MED ORDER — PROMETHAZINE HCL 25 MG/ML IJ SOLN: 6.2500 mg | INTRAMUSCULAR | Status: DC | PRN

## 2021-03-01 MED ORDER — SUCCINYLCHOLINE CHLORIDE 200 MG/10ML IV SOSY
PREFILLED_SYRINGE | INTRAVENOUS | Status: AC
  Filled 2021-03-01: qty 10

## 2021-03-01 MED ORDER — FENTANYL CITRATE (PF) 250 MCG/5ML IJ SOLN
INTRAMUSCULAR | Status: DC | PRN
  Administered 2021-03-01: 50 ug via INTRAVENOUS

## 2021-03-01 MED ORDER — BUPIVACAINE HCL (PF) 0.5 % IJ SOLN
INTRAMUSCULAR | Status: DC | PRN
  Administered 2021-03-01: 15 mL via PERINEURAL

## 2021-03-01 MED ORDER — ROCURONIUM BROMIDE 10 MG/ML (PF) SYRINGE
PREFILLED_SYRINGE | INTRAVENOUS | Status: AC
  Filled 2021-03-01: qty 10

## 2021-03-01 MED ORDER — MIDAZOLAM HCL 2 MG/2ML IJ SOLN
INTRAMUSCULAR | Status: AC
Start: 2021-03-01 — End: 2021-03-01
  Administered 2021-03-01: 1 mg via INTRAVENOUS
  Filled 2021-03-01: qty 2

## 2021-03-01 MED ORDER — SUGAMMADEX SODIUM 200 MG/2ML IV SOLN
INTRAVENOUS | Status: DC | PRN
  Administered 2021-03-01: 200 mg via INTRAVENOUS

## 2021-03-01 MED ORDER — PROPOFOL 10 MG/ML IV BOLUS
INTRAVENOUS | Status: AC
  Filled 2021-03-01: qty 20

## 2021-03-01 MED ORDER — ONDANSETRON HCL 4 MG PO TABS: 4.0000 mg | ORAL_TABLET | Freq: Three times a day (TID) | ORAL | 1 refills | Status: DC | PRN

## 2021-03-01 MED ORDER — MIDAZOLAM HCL 2 MG/2ML IJ SOLN
INTRAMUSCULAR | Status: AC
  Filled 2021-03-01: qty 2

## 2021-03-01 MED ORDER — CEFAZOLIN SODIUM-DEXTROSE 2-4 GM/100ML-% IV SOLN
2.0000 g | INTRAVENOUS | Status: AC
  Administered 2021-03-01: 2 g via INTRAVENOUS
  Filled 2021-03-01: qty 100

## 2021-03-01 MED ORDER — LIDOCAINE 2% (20 MG/ML) 5 ML SYRINGE
INTRAMUSCULAR | Status: DC | PRN
  Administered 2021-03-01: 60 mg via INTRAVENOUS

## 2021-03-01 MED ORDER — OXYCODONE HCL 5 MG PO TABS: 5.0000 mg | ORAL_TABLET | ORAL | 0 refills | Status: DC | PRN

## 2021-03-01 MED ORDER — ONDANSETRON HCL 4 MG/2ML IJ SOLN
INTRAMUSCULAR | Status: DC | PRN
  Administered 2021-03-01: 4 mg via INTRAVENOUS

## 2021-03-01 MED ORDER — PROPOFOL 10 MG/ML IV BOLUS
INTRAVENOUS | Status: DC | PRN
  Administered 2021-03-01: 170 mg via INTRAVENOUS

## 2021-03-01 MED ORDER — ONDANSETRON HCL 4 MG/2ML IJ SOLN
INTRAMUSCULAR | Status: AC
  Filled 2021-03-01: qty 2

## 2021-03-01 MED ORDER — SODIUM CHLORIDE 0.9 % IR SOLN
Status: DC | PRN
  Administered 2021-03-01 (×2): 3000 mL
  Administered 2021-03-01: 1000 mL
  Administered 2021-03-01: 3000 mL

## 2021-03-01 MED ORDER — PHENYLEPHRINE 40 MCG/ML (10ML) SYRINGE FOR IV PUSH (FOR BLOOD PRESSURE SUPPORT)
PREFILLED_SYRINGE | INTRAVENOUS | Status: AC
  Filled 2021-03-01: qty 10

## 2021-03-01 MED ORDER — EPINEPHRINE PF 1 MG/ML IJ SOLN
INTRAMUSCULAR | Status: DC | PRN
  Administered 2021-03-01 (×2): 1 mg

## 2021-03-01 MED ORDER — MIDAZOLAM HCL 2 MG/2ML IJ SOLN: 1.0000 mg | Freq: Once | INTRAMUSCULAR | Status: AC

## 2021-03-01 MED ORDER — FENTANYL CITRATE (PF) 100 MCG/2ML IJ SOLN
INTRAMUSCULAR | Status: AC
Start: 2021-03-01 — End: 2021-03-01
  Administered 2021-03-01: 50 ug via INTRAVENOUS
  Filled 2021-03-01: qty 2

## 2021-03-01 SURGICAL SUPPLY — 48 items
ANCHOR SUT BIO SW 4.75X19.1 (Anchor) ×2 IMPLANT
ANCHOR SUT FBRTK 2.6 SOFT 1.7 (Anchor) ×1 IMPLANT
ANCHOR SUT FBRTK 2.6 SP1.7 TAP (Anchor) ×1 IMPLANT
BAG COUNTER SPONGE SURGICOUNT (BAG) ×2 IMPLANT
BLADE SURG 11 STRL SS (BLADE) ×2 IMPLANT
BURR OVAL 8 FLU 4.0X13 (MISCELLANEOUS) ×1 IMPLANT
CANNULA TWIST IN 8.25X7CM (CANNULA) ×4 IMPLANT
CLSR STERI-STRIP ANTIMIC 1/2X4 (GAUZE/BANDAGES/DRESSINGS) ×1 IMPLANT
CUTTER BONE 4.0MM X 13CM (MISCELLANEOUS) ×1 IMPLANT
DRAPE INCISE IOBAN 66X45 STRL (DRAPES) IMPLANT
DRAPE STERI 35X30 U-POUCH (DRAPES) ×2 IMPLANT
DRAPE U-SHAPE 47X51 STRL (DRAPES) ×2 IMPLANT
DRSG PAD ABDOMINAL 8X10 ST (GAUZE/BANDAGES/DRESSINGS) ×4 IMPLANT
DURAPREP 26ML APPLICATOR (WOUND CARE) ×2 IMPLANT
GAUZE SPONGE 4X4 12PLY STRL (GAUZE/BANDAGES/DRESSINGS) ×2 IMPLANT
GAUZE SPONGE 4X4 12PLY STRL LF (GAUZE/BANDAGES/DRESSINGS) ×1 IMPLANT
GLOVE SRG 8 PF TXTR STRL LF DI (GLOVE) ×1 IMPLANT
GLOVE SURG ENC MOIS LTX SZ7.5 (GLOVE) ×4 IMPLANT
GLOVE SURG UNDER POLY LF SZ8 (GLOVE) ×1
GOWN STRL REUS W/ TWL LRG LVL3 (GOWN DISPOSABLE) ×1 IMPLANT
GOWN STRL REUS W/TWL LRG LVL3 (GOWN DISPOSABLE) ×1
GOWN STRL REUS W/TWL XL LVL3 (GOWN DISPOSABLE) ×4 IMPLANT
KIT BASIN OR (CUSTOM PROCEDURE TRAY) ×2 IMPLANT
KIT TURNOVER KIT B (KITS) ×2 IMPLANT
MANIFOLD NEPTUNE II (INSTRUMENTS) ×2 IMPLANT
NDL SCORPION MULTI FIRE (NEEDLE) IMPLANT
NDL SPNL 18GX3.5 QUINCKE PK (NEEDLE) ×1 IMPLANT
NEEDLE SCORPION MULTI FIRE (NEEDLE) ×2 IMPLANT
NEEDLE SPNL 18GX3.5 QUINCKE PK (NEEDLE) ×2 IMPLANT
NS IRRIG 1000ML POUR BTL (IV SOLUTION) ×2 IMPLANT
PACK SHOULDER (CUSTOM PROCEDURE TRAY) ×2 IMPLANT
PAD ARMBOARD 7.5X6 YLW CONV (MISCELLANEOUS) ×4 IMPLANT
PORT APPOLLO RF 90DEGREE MULTI (SURGICAL WAND) ×1 IMPLANT
PROBE BIPOLAR ATHRO 135MM 90D (MISCELLANEOUS) IMPLANT
SLEEVE ARM SUSPENSION SYSTEM (MISCELLANEOUS) ×2 IMPLANT
SLING S3 LATERAL DISP (MISCELLANEOUS) ×2 IMPLANT
SPONGE T-LAP 4X18 ~~LOC~~+RFID (SPONGE) ×2 IMPLANT
SUT ETHILON 3 0 PS 1 (SUTURE) ×2 IMPLANT
SUT MNCRL AB 3-0 PS2 18 (SUTURE) ×1 IMPLANT
SUT TIGER TAPE 7 IN WHITE (SUTURE) IMPLANT
SYSTEM TENODESIS BC LNT 3.9 (Orthopedic Implant) ×1 IMPLANT
TAPE CLOTH 4X10 WHT NS (GAUZE/BANDAGES/DRESSINGS) ×1 IMPLANT
TAPE FIBER 2MM 7IN #2 BLUE (SUTURE) IMPLANT
TAPE PAPER 3X10 WHT MICROPORE (GAUZE/BANDAGES/DRESSINGS) ×2 IMPLANT
TOWEL GREEN STERILE (TOWEL DISPOSABLE) ×2 IMPLANT
TOWEL GREEN STERILE FF (TOWEL DISPOSABLE) ×2 IMPLANT
TUBING ARTHROSCOPY IRRIG 16FT (MISCELLANEOUS) ×2 IMPLANT
WATER STERILE IRR 1000ML POUR (IV SOLUTION) ×2 IMPLANT

## 2021-03-01 NOTE — Transfer of Care (Signed)
Immediate Anesthesia Transfer of Care Note  Patient: Sara Parrish  Procedure(s) Performed: SHOULDER ARTHROSCOPY WITH ROTATOR CUFF REPAIR,DISTAL CLAVICLE RESECTION, BICEPS TENODESIS,SUBACROMIAL DECOMPRESSION (Right: Shoulder)  Patient Location: PACU  Anesthesia Type:General and Regional  Level of Consciousness: oriented, drowsy and patient cooperative  Airway & Oxygen Therapy: Patient Spontanous Breathing and Patient connected to nasal cannula oxygen  Post-op Assessment: Report given to RN and Post -op Vital signs reviewed and stable  Post vital signs: Reviewed  Last Vitals:  Vitals Value Taken Time  BP 140/70 03/01/21 1636  Temp 36.6 C 03/01/21 1635  Pulse 99 03/01/21 1639  Resp 18 03/01/21 1639  SpO2 92 % 03/01/21 1639  Vitals shown include unvalidated device data.  Last Pain:  Vitals:   03/01/21 1335  TempSrc:   PainSc: 0-No pain      Patients Stated Pain Goal: 0 (02/33/43 5686)  Complications: No notable events documented.

## 2021-03-01 NOTE — Anesthesia Procedure Notes (Signed)
Procedure Name: Intubation Date/Time: 03/01/2021 2:31 PM Performed by: Jenne Campus, CRNA Pre-anesthesia Checklist: Patient identified, Emergency Drugs available, Suction available and Patient being monitored Patient Re-evaluated:Patient Re-evaluated prior to induction Oxygen Delivery Method: Circle System Utilized Preoxygenation: Pre-oxygenation with 100% oxygen Induction Type: IV induction Ventilation: Mask ventilation without difficulty Laryngoscope Size: Miller and 3 Grade View: Grade I Tube type: Oral Tube size: 7.0 mm Number of attempts: 1 Airway Equipment and Method: Stylet and Oral airway Placement Confirmation: ETT inserted through vocal cords under direct vision, positive ETCO2 and breath sounds checked- equal and bilateral Secured at: 21 cm Tube secured with: Tape Dental Injury: Teeth and Oropharynx as per pre-operative assessment

## 2021-03-01 NOTE — Brief Op Note (Signed)
03/01/2021  4:13 PM  PATIENT:  Sara Parrish  56 y.o. female  PRE-OPERATIVE DIAGNOSIS:  Right shoulder rotator cuff tear, biceps tear, impingement  POST-OPERATIVE DIAGNOSIS:  Right shoulder rotator cuff tear, biceps tear, impingement  PROCEDURE:  Procedure(s) with comments: SHOULDER ARTHROSCOPY WITH ROTATOR CUFF REPAIR,DISTAL CLAVICLE RESECTION, BICEPS TENODESIS,SUBACROMIAL DECOMPRESSION (Right) - 120  SURGEON:  Surgeon(s) and Role:    * Stann Mainland, Elly Modena, MD - Primary  PHYSICIAN ASSISTANT: Jonelle Sidle, PA-C   ANESTHESIA:   regional and general  EBL:  25 mL   BLOOD ADMINISTERED:none  DRAINS: none   LOCAL MEDICATIONS USED:  NONE  SPECIMEN:  No Specimen  DISPOSITION OF SPECIMEN:  N/A  COUNTS:  YES  TOURNIQUET:  * No tourniquets in log *  DICTATION: .Note written in EPIC  PLAN OF CARE: Discharge to home after PACU  PATIENT DISPOSITION:  PACU - hemodynamically stable.   Delay start of Pharmacological VTE agent (>24hrs) due to surgical blood loss or risk of bleeding: not applicable

## 2021-03-01 NOTE — Discharge Instructions (Signed)
Orthopedic surgery discharge instructions:  -Maintain postoperative bandages for 3 days.  He may remove these bandages on Thursday and begin showering at that time.  Please do not submerge underwater.  -Maintain your arm in sling at all times.  You should only remove for showering and getting dressed.  No lifting with the right arm.  -For mild to moderate pain use Tylenol and Advil in alternating fashion around-the-clock.  For breakthrough pain use oxycodone as necessary.  -Please apply ice to the right shoulder for 2030 minutes out of each hour that you are awake.  Do this around-the-clock for the first 3 days from surgery.  -Follow-up in 2 weeks for routine postoperative check.

## 2021-03-01 NOTE — Anesthesia Procedure Notes (Signed)
Anesthesia Regional Block: Interscalene brachial plexus block   Pre-Anesthetic Checklist: , timeout performed,  Correct Patient, Correct Site, Correct Laterality,  Correct Procedure, Correct Position, site marked,  Risks and benefits discussed,  Surgical consent,  Pre-op evaluation,  At surgeon's request and post-op pain management  Laterality: Right  Prep: chloraprep       Needles:   Needle Type: Echogenic Stimulator Needle     Needle Length: 10cm  Needle Gauge: 21   Needle insertion depth: 6 cm   Additional Needles:   Procedures:,,,, ultrasound used (permanent image in chart),,   Motor weakness within 5 minutes.  Narrative:  Start time: 03/01/2021 1:26 PM End time: 03/01/2021 1:31 PM Injection made incrementally with aspirations every 5 mL.  Performed by: Personally  Anesthesiologist: Josephine Igo, MD  Additional Notes: Timeout performed. Patient sedated. Relevant anatomy ID'd using [place]. Incremental 2-66ml injection of LA with frequent aspiration. Patient tolerated procedure well.     Right Interscalene Block

## 2021-03-01 NOTE — H&P (Signed)
ORTHOPAEDIC H&P  REQUESTING PHYSICIAN: Nicholes Stairs, MD  PCP:  Cathie Olden, MD  Chief Complaint: Right shoulder rotator cuff tear  HPI: Sara Parrish is a 56 y.o. female who complains of right shoulder pain and weakness.  Here today for arthroscopic management of a rotator cuff tear.  We discussed procedure in detail in the office.  No new complaints at this time.  No new questions or concerns.  Past Medical History:  Diagnosis Date   Allergy    Arthritis    Bipolar disorder ([place])    CKD (chronic kidney disease)    3a   Depression    Diabetes mellitus without complication (HCC)    Type 2   Elevated cholesterol    GERD (gastroesophageal reflux disease)    Hypertension    Liver disease    Tachycardia 09/08/2019   Past Surgical History:  Procedure Laterality Date   CHOLECYSTECTOMY  1990   COLONOSCOPY  05/25/2014   KNEE ARTHROSCOPY Right 2009   TUBAL LIGATION  1989   Social History   Socioeconomic History   Marital status: Married    Spouse name: Not on file   Number of children: 2   Years of education: Not on file   Highest education level: Not on file  Occupational History   Not on file  Tobacco Use   Smoking status: Never   Smokeless tobacco: Never  Vaping Use   Vaping Use: Never used  Substance and Sexual Activity   Alcohol use: Never   Drug use: Never   Sexual activity: Not on file  Other Topics Concern   Not on file  Social History Narrative   Not on file   Social Determinants of Health   Financial Resource Strain: Not on file  Food Insecurity: Not on file  Transportation Needs: Not on file  Physical Activity: Not on file  Stress: Not on file  Social Connections: Not on file   History reviewed. No pertinent family history. Allergies  Allergen Reactions   Metoprolol Itching and Rash   Prior to Admission medications   Medication Sig Start Date End Date Taking? Authorizing Provider  aspirin 81 MG EC tablet Take 81 mg  by mouth in the morning.   Yes [provider]  carvedilol (COREG) 6.25 MG tablet Take 6.25 mg by mouth See admin instructions. Take 12.5 mg by mouth in the morning and 6.25 mg at night 09/25/20  Yes [provider]  Cholecalciferol (VITAMIN D3) 50 MCG (2000 UT) capsule Take 2,000 Units by mouth in the morning.   Yes [provider]  cloNIDine HCl (KAPVAY) 0.1 MG TB12 ER tablet Take 0.1 mg by mouth 2 (two) times daily. 07/18/20  Yes [provider]  diltiazem (CARDIZEM CD) 120 MG 24 hr capsule Take 120 mg by mouth in the morning. 02/08/21  Yes [provider]  DULoxetine (CYMBALTA) 60 MG capsule Take 60 mg by mouth every morning. 08/20/20  Yes [provider]  hydrOXYzine (ATARAX/VISTARIL) 50 MG tablet Take 50 mg by mouth 2 (two) times daily as needed for anxiety. 07/14/20  Yes [provider]  lamoTRIgine (LAMICTAL) 100 MG tablet Take 100-200 mg by mouth See admin instructions. Take 100 mg by mouth in the morning and 200 mg in the evening 08/20/20  Yes [provider]  LINZESS 72 MCG capsule Take 72 mcg by mouth every morning. 08/20/20  Yes [provider]  Melatonin 10 MG TABS Take 10 tablets by mouth at bedtime.  Yes [provider]  metFORMIN (GLUCOPHAGE) 1000 MG tablet Take 1,000 mg by mouth 2 (two) times daily. 09/10/20  Yes [provider]  mirtazapine (REMERON) 15 MG tablet Take 7.5 mg by mouth at bedtime. 07/14/20  Yes [provider]  Multiple Vitamin (MULTIVITAMIN WITH MINERALS) TABS tablet Take 1 tablet by mouth daily.   Yes [provider]  omeprazole (PRILOSEC) 40 MG capsule Take 40 mg by mouth in the morning. 09/25/20  Yes [provider]  OZEMPIC, 1 MG/DOSE, 4 MG/3ML SOPN Inject 1.25 mg into the skin once a week. 07/24/20  Yes [provider]  REXULTI 3 MG TABS Take 1.5 mg by mouth at bedtime. 08/31/20  Yes [provider]  simvastatin (ZOCOR) 20 MG tablet  Take 10 mg by mouth at bedtime. 08/30/20  Yes [provider]  topiramate (TOPAMAX) 25 MG tablet Take 25 mg by mouth 2 (two) times daily.   Yes [provider]  Vitamin E 180 MG (400 UNIT) CAPS Take 800 Units by mouth in the morning. 12/21/19  Yes [provider]  Accu-Chek FastClix Lancets MISC Inject into the skin. 03/19/18   [provider]  glucose blood (PRECISION QID TEST) test strip 1 strip by external route two times daily Accheck Guide strips E11.9 03/19/18   [provider]   No results found.  Positive ROS: All other systems have been reviewed and were otherwise negative with the exception of those mentioned in the HPI and as above.  Physical Exam: General: Alert, no acute distress Cardiovascular: No pedal edema Respiratory: No cyanosis, no use of accessory musculature GI: No organomegaly, abdomen is soft and non-tender Skin: No lesions in the area of chief complaint Neurologic: Sensation intact distally Psychiatric: Patient is competent for consent with normal mood and affect Lymphatic: No axillary or cervical lymphadenopathy  MUSCULOSKELETAL: Right upper extremity is warm and well-perfused with no open wounds or lesions.  Neurovascular intact.  Assessment: 1.  Right shoulder rotator cuff tear, complete. 2.  Right shoulder subacromial impingement 3.  Right shoulder symptomatic acromioclavicular joint arthritis 4.  Right shoulder proximal biceps tendinitis  Plan: -Our plan is to proceed today with arthroscopic treatment of the shoulder.  We again reviewed the indications for the procedure and discussed the procedure in detail.  Plan for arthroscopic rotator cuff repair with distal clavicle resection proximal biceps tenodesis and subacromial decompression.  We discussed the risk of bleeding, infection, damage to surrounding nerves and vessels, stiffness, failure of repairs, progression of disease, need for revision surgery, development of  stiffness as well as risk of anesthesia.  She has provided informed consent.  -Plan to proceed with discharge home from PACU postoperatively.    Nicholes Stairs, MD Cell [phone]    03/01/2021 1:59 PM

## 2021-03-01 NOTE — Progress Notes (Signed)
Orthopedic Tech Progress Note Patient Details:  Sara Parrish [birth year] [id number]  Ortho Devices Type of Ortho Device: Shoulder abduction pillow Ortho Device/Splint Interventions: Ordered      [name] [name] 03/01/2021, 4:26 PM

## 2021-03-01 NOTE — Op Note (Signed)
03/01/2021   PATIENT:  Sara Parrish    PRE-OPERATIVE DIAGNOSIS:   1.  Right shoulder complete rotator cuff tear 2.  Right shoulder proximal biceps tendinitis 3.  Right shoulder subacromial impingement 4.  Right shoulder acromioclavicular joint arthritis 5.  Right shoulder type II SLAP tear  POST-OPERATIVE DIAGNOSIS:  Same  PROCEDURE:   1.  Right shoulder arthroscopic biceps tenodesis 2.  Right shoulder arthroscopic rotator cuff tendon repair 3.  Right shoulder arthroscopic subacromial decompression 4.  Right shoulder arthroscopic distal clavicle resection ([place] procedure)  SURGEON:  Nicholes Stairs, MD  PHYSICIAN ASSISTANT: Jonelle Sidle, PA-C  Assistant attestation:  PA Mcclung present for the entire procedure.  Participated in all critical portions.  ANESTHESIA:   General  ESTIMATED BLOOD LOSS: 5 cc  PREOPERATIVE INDICATIONS:  Sara Parrish is a  56 y.o. female with a diagnosis of Right shoulder rotator cuff tear, biceps tear, impingement who failed conservative measures and elected for surgical management.    The risks benefits and alternatives were discussed with the patient preoperatively including but not limited to the risks of infection, bleeding, nerve injury, cardiopulmonary complications, the need for revision surgery, among others, and the patient was willing to proceed.  OPERATIVE IMPLANTS: Arthrex 3.9 mm swivel lock anchor for biceps tenodesis Arthrex 2.6 mm fiber tack knotless suture anchor x2 for medial row and Arthrex 4.75 mm swivel lock anchor x2 for lateral row  OPERATIVE FINDINGS: On the diagnostic portion of the procedure we began in the intra-articular portion.  In the intra-articular portion she had moderate glenohumeral osteoarthritis with areas of grade IV chondromalacia on the superior and anterior aspect of the humeral head and grade II and III chondromalacia anterior glenoid.  There was anterior degenerative labrum tearing.  There is an  unstable type II SLAP tear noted superiorly.  Long head biceps tendinitis noted as well.  Subscapularis upper border split tearing but no full-thickness tear.  Teres minor was intact.  Posterior aspect of the infraspinatus was intact with just some partial articular sided tearing noted of the anterior aspect of the infraspinatus.  The supraspinatus was completely torn in a crescent pattern.  Retracted approximately 2 cm.  In the subacromial space there was moderate bursitis noted as well as type II acromion with moderate acromioclavicular joint arthropathy with sclerosis and cystic change in the distal clavicle.  UNIQUE ASPECTS OF THE CASE:   Bone quality was poor.  OPERATIVE PROCEDURE: The patient was brought to the operating room and placed in the supine position. General anesthesia was administered. IV antibiotics were given. General anesthesia was administered.    The upper extremity was prepped and draped in the usual sterile fashion. The patient was in a semilateral decubitus position.  Time out was performed. Diagnostic arthroscopy was carried out the above-named findings.   We began the procedure with the intra-articular portion.  First established a posterior viewing portal.  This was done 2 cm distant 1 cm medial to the posterior lateral corner of the acromion.  We then established a mid glenoid working portal via direct spinal needle localization just off of the upper border of the subscapularis into the rotator interval.  We identified the above named findings.  We then moved ahead with the arthroscopic biceps tenodesis.  Utilizing a loop and tack method we placed a fiber link suture around the biceps tendon and then pierced the with a half racking stitch utilizing a antegrade suture passer.  We then tenotomized the biceps of the superior  labrum from the unstable type II SLAP tear.  We then loaded this into a 3.9 mm swivel lock anchor just off of the superior border of the subscapularis had  previously been prepared with motorized shaver.  We then introduced the anchor and tensioned the biceps along the upper border of subscapularis and the superior aspect of the intertubercular groove.  This completed the arthroscopic biceps tenodesis.  We next turned our attention to the rotator cuff repair.  We moved into the subacromial space.  We then established a lateral working portal about 4 cm distant to the lateral border of the acromion in the midportion of the [facility] joint.  We first performed a wide bursectomy to identify the tear.  We then biologically prepared the greater tuberosity with motorized shaver.  We performed releases of the rotator cuff tear with the radiofrequency wand and blunt dissection.  We identified a crescent-shaped tear of the entire width of the supraspinatus.  This was a crescent tear that measured 2.5 cm from anterior to posterior 2 cm medial to lateral.  We elected to use a transosseous double row technique.  We placed 2 2.6 mm Arthrex fiber tack anchors at the articular margin.  We then passed both the fiber tape and suture tape limbs through the tendon at the myotendinous junction.  This left [place] with a total of 8 limbs.  We then divided these into 4 and 4 limbs.  4 limbs went to the anterior lateral anchor and 4 limbs go to the posterior lateral anchor.  These were loaded into a 4.75 mm swivel lock anchors.  We tensioned these while administering them into the lateral aspect of the greater tuberosity to complete the repair.  This created good tendon to bone compression.  We then turned our attention to the subacromial decompression.  While viewing from the lateral portal and working from the posterior portal utilized a cutting block technique.  We reflected the coracoacromial ligament off of the anterolateral aspect of the acromion.  This identified a type II acromion.  We then utilized a motorized bur from posterior portal to resect and flatten this down to a type I  acromion.  Lastly, we moved to our distal clavicle resection.  While viewing from the lateral portal and working from the anterior portal we utilized the radiofrequency wand to identify a sclerotic and cystic distal clavicle.  There is also a large undersurface spur on the acromion.  The motorized bur was introduced and about 1 cm of distal clavicle was resected as well as 2 to 3 mm of medial acromion.  Pictures were taken throughout the procedure.   the arthroscopic cannulas were removed, and the portals closed with Monocryl followed by Steri-Strips and sterile gauze. The patient was awakened and returned to the PACU in stable and satisfactory condition. There were no complications and the patient tolerated the procedure well.  All counts were correct.  Disposition:  The patient will be nonweightbearing with an abduction sling to the operative extremity.  He may begin scapular retractions and elbow hand and wrist range motion as tolerated.  He will begin physical therapy in 1 week.  I will see them back in the office in 2 weeks for a wound check.  They will be on our less than 3 cm rotator cuff repair protocol.

## 2021-03-02 NOTE — Anesthesia Postprocedure Evaluation (Signed)
Anesthesia Post Note  Patient: Evanee Lubrano  Procedure(s) Performed: SHOULDER ARTHROSCOPY WITH ROTATOR CUFF REPAIR,DISTAL CLAVICLE RESECTION, BICEPS TENODESIS,SUBACROMIAL DECOMPRESSION (Right: Shoulder)     Patient location during evaluation: PACU Anesthesia Type: Regional and General Level of consciousness: awake Pain management: pain level controlled Vital Signs Assessment: post-procedure vital signs reviewed and stable Respiratory status: spontaneous breathing, nonlabored ventilation, respiratory function stable and patient connected to nasal cannula oxygen Cardiovascular status: blood pressure returned to baseline and stable Postop Assessment: no apparent nausea or vomiting Anesthetic complications: no   No notable events documented.  Last Vitals:  Vitals:   03/01/21 1650 03/01/21 1705  BP: (!) 117/46 111/75  Pulse: 98 97  Resp: 17 19  Temp:  36.6 C  SpO2: 90% 95%    Last Pain:  Vitals:   03/01/21 1705  TempSrc:   PainSc: 0-No pain                 Tanelle Lanzo P Worth Kober

## 2021-03-04 ENCOUNTER — Encounter (HOSPITAL_COMMUNITY): Payer: Self-pay | Admitting: Orthopedic Surgery

## 2021-07-13 DEATH — deceased

## 2021-09-26 ENCOUNTER — Ambulatory Visit: Payer: BC Managed Care – PPO | Admitting: Physician Assistant

## 2021-11-05 NOTE — Progress Notes (Signed)
Sent message, via epic in basket, requesting orders in epic from surgeon.  

## 2021-11-06 NOTE — H&P (Signed)
Patient's anticipated LOS is less than 2 midnights, meeting these requirements: - Younger than 16 - Lives within 1 hour of care - Has a competent adult at home to recover with post-op recover - NO history of  - Chronic pain requiring opiods  - Diabetes  - Coronary Artery Disease  - Heart failure  - Heart attack  - Stroke  - DVT/VTE  - Cardiac arrhythmia  - Respiratory Failure/COPD  - Renal failure  - Anemia  - Advanced Liver disease     Sara Parrish is an 56 y.o. female.    Chief Complaint: right knee pain  HPI: Pt is a 56 y.o. female complaining of right knee pain for multiple years. Pain had continually increased since the beginning. X-rays in the clinic show end-stage arthritic changes of the right knee. Pt has tried various conservative treatments which have failed to alleviate their symptoms, including injections and therapy. Various options are discussed with the patient. Risks, benefits and expectations were discussed with the patient. Patient understand the risks, benefits and expectations and wishes to proceed with surgery.   PCP:  Cathie Olden, MD  D/C Plans: Home  PMH: Past Medical History:  Diagnosis Date   Allergy    Arthritis    Bipolar disorder ([place])    CKD (chronic kidney disease)    3a   Depression    Diabetes mellitus without complication ([place])    Type 2   Elevated cholesterol    GERD (gastroesophageal reflux disease)    Hypertension    Liver disease    Tachycardia 09/08/2019    PSH: Past Surgical History:  Procedure Laterality Date   CHOLECYSTECTOMY  1990   COLONOSCOPY  05/25/2014   KNEE ARTHROSCOPY Right 2009   SHOULDER ARTHROSCOPY WITH ROTATOR CUFF REPAIR Right 03/01/2021   Procedure: SHOULDER ARTHROSCOPY WITH ROTATOR CUFF REPAIR,DISTAL CLAVICLE RESECTION, BICEPS TENODESIS,SUBACROMIAL DECOMPRESSION;  Surgeon: Nicholes Stairs, MD;  Location: [facility];  Service: Orthopedics;  Laterality: Right;  [place]     Social History:  reports that she has never smoked. She has never used smokeless tobacco. She reports that she does not drink alcohol and does not use drugs. BMI: Estimated body mass index is 30.4 kg/m as calculated from the following:   Height as of 03/01/21: 5' 7.5" (1.715 m).   Weight as of 03/01/21: 89.4 kg.  Lab Results  Component Value Date   ALBUMIN 3.9 02/20/2021   Diabetes: Patient does not have a diagnosis of diabetes. Lab Results  Component Value Date   HGBA1C 4.9 02/20/2021     Smoking Status:   reports that she has never smoked. She has never used smokeless tobacco.    Allergies:  Allergies  Allergen Reactions   Metoprolol Itching and Rash    Medications: No current facility-administered medications for this encounter.   Current Outpatient Medications  Medication Sig Dispense Refill   Accu-Chek FastClix Lancets MISC Inject into the skin.     aspirin 81 MG EC tablet Take 81 mg by mouth in the morning.     carvedilol (COREG) 6.25 MG tablet Take 6.25 mg by mouth See admin instructions. Take 12.5 mg by mouth in the morning and 6.25 mg at night     Cholecalciferol (VITAMIN D3) 50 MCG (2000 UT) capsule Take 2,000 Units by mouth in the morning.     cloNIDine HCl (KAPVAY) 0.1 MG TB12 ER tablet Take 0.1 mg by mouth 2 (two) times daily.  diltiazem (CARDIZEM CD) 120 MG 24 hr capsule Take 120 mg by mouth in the morning.     DULoxetine (CYMBALTA) 60 MG capsule Take 60 mg by mouth every morning.     glucose blood (PRECISION QID TEST) test strip 1 strip by external route two times daily Accheck Guide strips E11.9     hydrOXYzine (ATARAX/VISTARIL) 50 MG tablet Take 50 mg by mouth 2 (two) times daily as needed for anxiety.     lamoTRIgine (LAMICTAL) 100 MG tablet Take 100-200 mg by mouth See admin instructions. Take 100 mg by mouth in the morning and 200 mg in the evening     LINZESS 72 MCG capsule Take 72 mcg by mouth every morning.     Melatonin 10 MG TABS Take 10  tablets by mouth at bedtime.     metFORMIN (GLUCOPHAGE) 1000 MG tablet Take 1,000 mg by mouth 2 (two) times daily.     mirtazapine (REMERON) 15 MG tablet Take 7.5 mg by mouth at bedtime.     Multiple Vitamin (MULTIVITAMIN WITH MINERALS) TABS tablet Take 1 tablet by mouth daily.     omeprazole (PRILOSEC) 40 MG capsule Take 40 mg by mouth in the morning.     ondansetron (ZOFRAN) 4 MG tablet Take 1 tablet (4 mg total) by mouth every 8 (eight) hours as needed for nausea or vomiting. 30 tablet 1   oxyCODONE (ROXICODONE) 5 MG immediate release tablet Take 1 tablet (5 mg total) by mouth every 4 (four) hours as needed for severe pain or breakthrough pain. 20 tablet 0   OZEMPIC, 1 MG/DOSE, 4 MG/3ML SOPN Inject 1.25 mg into the skin once a week.     REXULTI 3 MG TABS Take 1.5 mg by mouth at bedtime.     simvastatin (ZOCOR) 20 MG tablet Take 10 mg by mouth at bedtime.     topiramate (TOPAMAX) 25 MG tablet Take 25 mg by mouth 2 (two) times daily.     Vitamin E 180 MG (400 UNIT) CAPS Take 800 Units by mouth in the morning.      No results found for this or any previous visit (from the past 48 hour(s)). No results found.  ROS: Pain with rom of the right lower extremity  Physical Exam: Alert and oriented 55 y.o. female in no acute distress Cranial nerves 2-12 intact Cervical spine: full rom with no tenderness, nv intact distally Chest: active breath sounds bilaterally, no wheeze rhonchi or rales Heart: regular rate and rhythm, no murmur Abd: non tender non distended with active bowel sounds Hip is stable with rom  Right knee painful rom Nv intact distally Antalgic gait  Assessment/Plan Assessment: right knee end stage osteoarthritis  Plan:  Patient will undergo a right total knee by Dr. Veverly Fells at [facility] Risks benefits and expectations were discussed with the patient. Patient understand risks, benefits and expectations and wishes to proceed. Preoperative templating of the joint replacement has  been completed, documented, and submitted to the Operating Room personnel in order to optimize intra-operative equipment management.   Merla Riches PA-C, MPAS [facility] is now [facility] [address], [place], [place],  [address] Phone: [phone] [email or web address] Facebook  [employer]

## 2021-11-06 NOTE — Progress Notes (Addendum)
Anesthesia Review:  PCP: Kelby Aline LOV 09/19/21 for UTI  PCP- Dr Lorne Skeens in [place] 09/10/21- Care Everywhere  Cardiologist : DR Dianah Field at [facility] in [place] Has an appat on 11/21/2021 in the am  LOV 04/16/21- Care Everywhere  Chest x-ray : EKG : 02/20/21  Echo : Stress test: Cardiac Cath :  Activity level: can do a flgiht of stairs without difficutly  Sleep Study/ CPAP : none  Fasting Blood Sugar :      / Checks Blood Sugar -- times a day:   Blood Thinner/ Instructions /Last Dose: ASA / Instructions/ Last Dose :   81 mg aspirin  DM- type 2- checks glucose daily  Hgba1c- 11/12/21- 5.3   Ozempic- last dose was at least a month ago due to being in short supply per pt Instructed pt that if she does get a refill prior to surgery pt aware not to take for 7 days prior to surgery and voiced understanding.    CBC done 11/12/21 with white count of 11.3 routed to Dr Netta Cedars.

## 2021-11-07 NOTE — Patient Instructions (Signed)
SURGICAL WAITING ROOM VISITATION Patients having surgery or a procedure may have no more than 2 support people in the waiting area - these visitors may rotate.   Children under the age of 68 must have an adult with them who is not the patient. If the patient needs to stay at the hospital during part of their recovery, the visitor guidelines for inpatient rooms apply. Pre-op nurse will coordinate an appropriate time for 1 support person to accompany patient in pre-op.  This support person may not rotate.    Please refer to the [facility] website for the visitor guidelines for Inpatients (after your surgery is over and you are in a regular room).       Your procedure is scheduled on:  11/22/2021    Report to [facility] Main Entrance    Report to admitting at  0730 AM   Call this number if you have problems the morning of surgery [phone]   Do not eat food :After Midnight.   After Midnight you may have the following liquids until __ 0700____ AM  DAY OF SURGERY  Water Non-Citrus Juices (without pulp, NO RED) Carbonated Beverages Black Coffee (NO MILK/CREAM OR CREAMERS, sugar ok)  Clear Tea (NO MILK/CREAM OR CREAMERS, sugar ok) regular and decaf                             Plain Jell-O (NO RED)                                           Fruit ices (not with fruit pulp, NO RED)                                     Popsicles (NO RED)                                                               Sports drinks like Gatorade (NO RED)                     The day of surgery:  Drink ONE (1) Pre-Surgery Clear Ensure or G2 at   0700AM  ( have completed by ) the morning of surgery. Drink in one sitting. Do not sip.  This drink was given to you during your hospital  pre-op appointment visit. Nothing else to drink after completing the  Pre-Surgery Clear Ensure or G2.          If you have questions, please contact your surgeon's office.       Oral Hygiene is also important to  reduce your risk of infection.                                    Remember - BRUSH YOUR TEETH THE MORNING OF SURGERY WITH YOUR REGULAR TOOTHPASTE   Do NOT smoke after Midnight   Take these medicines the morning of surgery with A SIP OF WATER:  topamax, coreg, cardizem, omeprazole,  cymbalta, lamictalk linzess   DO NOT TAKE ANY ORAL DIABETIC MEDICATIONS DAY OF YOUR SURGERY  Bring CPAP mask and tubing day of surgery.                              You may not have any metal on your body including hair pins, jewelry, and body piercing             Do not wear make-up, lotions, powders, perfumes/cologne, or deodorant  Do not wear nail polish including gel and S&S, artificial/acrylic nails, or any other type of covering on natural nails including finger and toenails. If you have artificial nails, gel coating, etc. that needs to be removed by a nail salon please have this removed prior to surgery or surgery may need to be canceled/ delayed if the surgeon/ anesthesia feels like they are unable to be safely monitored.   Do not shave  48 hours prior to surgery.               Men may shave face and neck.   Do not bring valuables to the hospital. [place].   Contacts, dentures or bridgework may not be worn into surgery.   Bring small overnight bag day of surgery.   DO NOT [place]. PHARMACY WILL DISPENSE MEDICATIONS LISTED ON YOUR MEDICATION LIST TO YOU DURING YOUR ADMISSION [place]!    Patients discharged on the day of surgery will not be allowed to drive home.  Someone NEEDS to stay with you for the first 24 hours after anesthesia.   Special Instructions: Bring a copy of your healthcare power of attorney and living will documents the day of surgery if you haven't scanned them before.              Please read over the following fact sheets you were given: IF [place] [phone]   If you received a COVID test during your pre-op visit  it is requested that you wear a mask when out in public, stay away from anyone that may not be feeling well and notify your surgeon if you develop symptoms. If you test positive for Covid or have been in contact with anyone that has tested positive in the last 10 days please notify you surgeon.     [place] - Preparing for Surgery Before surgery, you can play an important role.  Because skin is not sterile, your skin needs to be as free of germs as possible.  You can reduce the number of germs on your skin by washing with CHG (chlorahexidine gluconate) soap before surgery.  CHG is an antiseptic cleaner which kills germs and bonds with the skin to continue killing germs even after washing. Please DO NOT use if you have an allergy to CHG or antibacterial soaps.  If your skin becomes reddened/irritated stop using the CHG and inform your nurse when you arrive at Short Stay. Do not shave (including legs and underarms) for at least 48 hours prior to the first CHG shower.  You may shave your face/neck. Please follow these instructions carefully:  1.  Shower with CHG Soap the night before surgery and the  morning of Surgery.  2.  If you choose to wash your hair, wash  your hair first as usual with your  normal  shampoo.  3.  After you shampoo, rinse your hair and body thoroughly to remove the  shampoo.                           4.  Use CHG as you would any other liquid soap.  You can apply chg directly  to the skin and wash                       Gently with a scrungie or clean washcloth.  5.  Apply the CHG Soap to your body ONLY FROM THE NECK DOWN.   Do not use on face/ open                           Wound or open sores. Avoid contact with eyes, ears mouth and genitals (private parts).                       Wash face,  Genitals (private parts) with your normal soap.             6.  Wash thoroughly, paying  special attention to the area where your surgery  will be performed.  7.  Thoroughly rinse your body with warm water from the neck down.  8.  DO NOT shower/wash with your normal soap after using and rinsing off  the CHG Soap.                9.  Pat yourself dry with a clean towel.            10.  Wear clean pajamas.            11.  Place clean sheets on your bed the night of your first shower and do not  sleep with pets. Day of Surgery : Do not apply any lotions/deodorants the morning of surgery.  Please wear clean clothes to the hospital/surgery center.  FAILURE TO FOLLOW THESE INSTRUCTIONS MAY RESULT IN THE CANCELLATION OF YOUR SURGERY PATIENT SIGNATURE_________________________________  NURSE SIGNATURE__________________________________  ________________________________________________________________________

## 2021-11-12 ENCOUNTER — Encounter (HOSPITAL_COMMUNITY): Payer: Self-pay

## 2021-11-12 ENCOUNTER — Encounter (HOSPITAL_COMMUNITY)
Admission: RE | Admit: 2021-11-12 | Discharge: 2021-11-12 | Disposition: A | Discharge status: Home / Self Care | Payer: BC Managed Care – PPO | Source: Ambulatory Visit | Attending: Orthopedic Surgery | Admitting: Orthopedic Surgery

## 2021-11-12 ENCOUNTER — Other Ambulatory Visit: Payer: Self-pay

## 2021-11-12 VITALS — BP 113/81 | HR 79 | Temp 98.3°F | Resp 16 | Ht 67.0 in | Wt 190.0 lb

## 2021-11-12 DIAGNOSIS — Z01812 Encounter for preprocedural laboratory examination: Secondary | ICD-10-CM | POA: Insufficient documentation

## 2021-11-12 DIAGNOSIS — Z7984 Long term (current) use of oral hypoglycemic drugs: Secondary | ICD-10-CM | POA: Diagnosis not present

## 2021-11-12 DIAGNOSIS — E1122 Type 2 diabetes mellitus with diabetic chronic kidney disease: Secondary | ICD-10-CM | POA: Insufficient documentation

## 2021-11-12 DIAGNOSIS — I129 Hypertensive chronic kidney disease with stage 1 through stage 4 chronic kidney disease, or unspecified chronic kidney disease: Secondary | ICD-10-CM | POA: Insufficient documentation

## 2021-11-12 DIAGNOSIS — N183 Chronic kidney disease, stage 3 unspecified: Secondary | ICD-10-CM | POA: Diagnosis not present

## 2021-11-12 DIAGNOSIS — Z01818 Encounter for other preprocedural examination: Secondary | ICD-10-CM

## 2021-11-12 DIAGNOSIS — M1711 Unilateral primary osteoarthritis, right knee: Secondary | ICD-10-CM | POA: Diagnosis not present

## 2021-11-12 HISTORY — DX: Anxiety disorder, unspecified: F41.9

## 2021-11-12 LAB — CBC
HCT: 36.1 % (ref 36.0–46.0)
Hemoglobin: 11.4 g/dL — ABNORMAL LOW (ref 12.0–15.0)
MCH: 25.9 pg — ABNORMAL LOW (ref 26.0–34.0)
MCHC: 31.6 g/dL (ref 30.0–36.0)
MCV: 81.9 fL (ref 80.0–100.0)
Platelets: 344 10*3/uL (ref 150–400)
RBC: 4.41 MIL/uL (ref 3.87–5.11)
RDW: 13.2 % (ref 11.5–15.5)
WBC: 11.3 10*3/uL — ABNORMAL HIGH (ref 4.0–10.5)
nRBC: 0 % (ref 0.0–0.2)

## 2021-11-12 LAB — BASIC METABOLIC PANEL
Anion gap: 8 (ref 5–15)
BUN: 19 mg/dL (ref 6–20)
CO2: 25 mmol/L (ref 22–32)
Calcium: 9.6 mg/dL (ref 8.9–10.3)
Chloride: 106 mmol/L (ref 98–111)
Creatinine, Ser: 1.17 mg/dL — ABNORMAL HIGH (ref 0.44–1.00)
GFR, Estimated: 54 mL/min — ABNORMAL LOW (ref >60)
Glucose, Bld: 116 mg/dL — ABNORMAL HIGH (ref 70–99)
Potassium: 4 mmol/L (ref 3.5–5.1)
Sodium: 139 mmol/L (ref 135–145)

## 2021-11-12 LAB — SURGICAL PCR SCREEN
MRSA, PCR: NEGATIVE
Staphylococcus aureus: NEGATIVE

## 2021-11-12 LAB — GLUCOSE, CAPILLARY: Glucose-Capillary: 95 mg/dL (ref 70–99)

## 2021-11-12 LAB — HEMOGLOBIN A1C
Hgb A1c MFr Bld: 5.3 % (ref 4.8–5.6)
Mean Plasma Glucose: 105.41 mg/dL

## 2021-11-21 NOTE — Anesthesia Preprocedure Evaluation (Addendum)
Anesthesia Evaluation  Patient identified by MRN, date of birth, ID band Patient awake    Reviewed: Allergy & Precautions, NPO status , Patient's Chart, lab work & pertinent test results  History of Anesthesia Complications Negative for: history of anesthetic complications  Airway Mallampati: II  TM Distance: >3 FB Neck ROM: Full    Dental  (+) Poor Dentition, Dental Advisory Given   Pulmonary neg pulmonary ROS   Pulmonary exam normal        Cardiovascular hypertension, Pt. on medications Normal cardiovascular exam  Lexiscan Stress Test 09/08/2019: IMPRESSION: SPECT imaging shows a normal study. EF is normal at 60%. Marked anterior wall breast attenuation artifact. No transient ischemic dilatation (0.92). No extracardiac tracer uptake.  ECHO 02/01/2020: 1. Overall left ventricular ejection fraction is estimated at 60 to 65%. 2. Normal global left ventricular systolic function. 3. (Grade 1) Mildly abnormal left ventricular diastolic filling. 4. Mild mitral annular calcification.       Neuro/Psych  PSYCHIATRIC DISORDERS Anxiety Depression Bipolar Disorder   negative neurological ROS     GI/Hepatic Neg liver ROS,GERD  ,,  Endo/Other  diabetes    Renal/GU negative Renal ROS     Musculoskeletal negative musculoskeletal ROS (+)    Abdominal   Peds  Hematology negative hematology ROS (+)   Anesthesia Other Findings   Reproductive/Obstetrics                             Anesthesia Physical Anesthesia Plan  ASA: 2  Anesthesia Plan:    Post-op Pain Management: Celebrex PO (pre-op)* and Tylenol PO (pre-op)*   Induction: Intravenous  PONV Risk Score and Plan: 3 and Ondansetron, Propofol infusion, Dexamethasone and Midazolam  Airway Management Planned: Natural Airway  Additional Equipment:   Intra-op Plan:   Post-operative Plan:   Informed Consent: I have reviewed the patients  History and Physical, chart, labs and discussed the procedure including the risks, benefits and alternatives for the proposed anesthesia with the patient or authorized representative who has indicated his/her understanding and acceptance.     Dental advisory given  Plan Discussed with: Anesthesiologist  Anesthesia Plan Comments: (See PAT note 11/12/2021)       Anesthesia Quick Evaluation

## 2021-11-21 NOTE — Progress Notes (Signed)
Anesthesia Chart Review   Case: [id number] Date/Time: 11/22/21 0945   Procedure: TOTAL KNEE ARTHROPLASTY (Right: Knee)   Anesthesia type: Choice   Pre-op diagnosis: Right knee end stage osteoarthritis   Location: [facility] ROOM 06 / [facility]   Surgeons: Netta Cedars, MD       DISCUSSION:56 y.o. never smoker with h/o DM II, HTN, CKD 3, right knee OA scheduled for above procedure 11/22/2021 with Dr. Netta Cedars.   Pt seen by PCP 09/10/2021 for preoperative evaluation.  Cleared for procedure.   Lexiscan Stress Test 09/08/2019: IMPRESSION: SPECT imaging shows a normal study. EF is normal at 60%. Marked anterior wall breast attenuation artifact. No transient ischemic dilatation (0.92). No extracardiac tracer uptake.  ECHO 02/01/2020: 1. Overall left ventricular ejection fraction is estimated at 60 to 65%. 2. Normal global left ventricular systolic function. 3. (Grade 1) Mildly abnormal left ventricular diastolic filling. 4. Mild mitral annular calcification.   Anticipate pt can proceed with planned procedure barring acute status change.   VS: BP 113/81   Pulse 79   Temp 36.8 C (Oral)   Resp 16   Ht '5\' 7"'$  (1.702 m)   Wt 86.2 kg   SpO2 98%   BMI 29.76 kg/m   PROVIDERS: Cathie Olden, MD is PCP    LABS: Labs reviewed: Acceptable for surgery. (all labs ordered are listed, but only abnormal results are displayed)  Labs Reviewed  BASIC METABOLIC PANEL - Abnormal; Notable for the following components:      Result Value   Glucose, Bld 116 (*)    Creatinine, Ser 1.17 (*)    GFR, Estimated 54 (*)    All other components within normal limits  CBC - Abnormal; Notable for the following components:   WBC 11.3 (*)    Hemoglobin 11.4 (*)    MCH 25.9 (*)    All other components within normal limits  SURGICAL PCR SCREEN  HEMOGLOBIN A1C  GLUCOSE, CAPILLARY     IMAGES:   EKG:   CV:  Past Medical History:  Diagnosis Date   Allergy    Anxiety    Arthritis     Bipolar disorder (HCC)    CKD (chronic kidney disease)    3a   Depression    Diabetes mellitus without complication (HCC)    Type 2   Elevated cholesterol    GERD (gastroesophageal reflux disease)    Hypertension    Liver disease    Tachycardia 09/08/2019    Past Surgical History:  Procedure Laterality Date   CHOLECYSTECTOMY  1990   COLONOSCOPY  05/25/2014   KNEE ARTHROSCOPY Right 2009   SHOULDER ARTHROSCOPY WITH ROTATOR CUFF REPAIR Right 03/01/2021   Procedure: SHOULDER ARTHROSCOPY WITH ROTATOR CUFF REPAIR,DISTAL CLAVICLE RESECTION, BICEPS TENODESIS,SUBACROMIAL DECOMPRESSION;  Surgeon: Nicholes Stairs, MD;  Location: [facility];  Service: Orthopedics;  Laterality: Right;  120   TUBAL LIGATION  1989    MEDICATIONS:  Accu-Chek FastClix Lancets MISC   aspirin 81 MG EC tablet   carvedilol (COREG) 6.25 MG tablet   Cholecalciferol (VITAMIN D3) 50 MCG (2000 UT) capsule   cloNIDine HCl (KAPVAY) 0.1 MG TB12 ER tablet   diltiazem (CARDIZEM CD) 120 MG 24 hr capsule   DULoxetine (CYMBALTA) 60 MG capsule   glucose blood (PRECISION QID TEST) test strip   hydrOXYzine (ATARAX/VISTARIL) 50 MG tablet   lamoTRIgine (LAMICTAL) 100 MG tablet   LINZESS 72 MCG capsule   Melatonin 10 MG TABS   metFORMIN (GLUCOPHAGE) 1000 MG tablet  mirtazapine (REMERON) 15 MG tablet   Multiple Vitamin (MULTIVITAMIN WITH MINERALS) TABS tablet   omeprazole (PRILOSEC) 40 MG capsule   ondansetron (ZOFRAN) 4 MG tablet   oxyCODONE (ROXICODONE) 5 MG immediate release tablet   OZEMPIC, 1 MG/DOSE, 4 MG/3ML SOPN   REXULTI 3 MG TABS   simvastatin (ZOCOR) 20 MG tablet   topiramate (TOPAMAX) 25 MG tablet   Vitamin E 180 MG (400 UNIT) CAPS   No current facility-administered medications for this encounter.     Konrad Felix Ward, PA-C [facility] [phone]

## 2021-11-22 ENCOUNTER — Encounter (HOSPITAL_COMMUNITY): Payer: Self-pay | Admitting: Orthopedic Surgery

## 2021-11-22 ENCOUNTER — Encounter (HOSPITAL_COMMUNITY)
Admission: RE | Disposition: A | Discharge status: Home / Self Care | Payer: Self-pay | Source: Home / Self Care | Attending: Orthopedic Surgery

## 2021-11-22 ENCOUNTER — Ambulatory Visit (HOSPITAL_COMMUNITY)
Admission: RE | Admit: 2021-11-22 | Discharge: 2021-11-22 | Disposition: A | Discharge status: Home / Self Care | Payer: BC Managed Care – PPO | Attending: Orthopedic Surgery | Admitting: Orthopedic Surgery

## 2021-11-22 ENCOUNTER — Ambulatory Visit (HOSPITAL_COMMUNITY): Payer: BC Managed Care – PPO | Admitting: Certified Registered Nurse Anesthetist

## 2021-11-22 ENCOUNTER — Other Ambulatory Visit: Payer: Self-pay

## 2021-11-22 ENCOUNTER — Ambulatory Visit (HOSPITAL_COMMUNITY): Payer: BC Managed Care – PPO | Admitting: Physician Assistant

## 2021-11-22 DIAGNOSIS — Z79899 Other long term (current) drug therapy: Secondary | ICD-10-CM | POA: Insufficient documentation

## 2021-11-22 DIAGNOSIS — Z7984 Long term (current) use of oral hypoglycemic drugs: Secondary | ICD-10-CM | POA: Insufficient documentation

## 2021-11-22 DIAGNOSIS — M1711 Unilateral primary osteoarthritis, right knee: Secondary | ICD-10-CM | POA: Insufficient documentation

## 2021-11-22 DIAGNOSIS — N1831 Chronic kidney disease, stage 3a: Secondary | ICD-10-CM | POA: Insufficient documentation

## 2021-11-22 DIAGNOSIS — K219 Gastro-esophageal reflux disease without esophagitis: Secondary | ICD-10-CM | POA: Insufficient documentation

## 2021-11-22 DIAGNOSIS — F419 Anxiety disorder, unspecified: Secondary | ICD-10-CM | POA: Insufficient documentation

## 2021-11-22 DIAGNOSIS — E119 Type 2 diabetes mellitus without complications: Secondary | ICD-10-CM | POA: Insufficient documentation

## 2021-11-22 DIAGNOSIS — E7800 Pure hypercholesterolemia, unspecified: Secondary | ICD-10-CM | POA: Diagnosis not present

## 2021-11-22 DIAGNOSIS — F319 Bipolar disorder, unspecified: Secondary | ICD-10-CM | POA: Insufficient documentation

## 2021-11-22 DIAGNOSIS — E1122 Type 2 diabetes mellitus with diabetic chronic kidney disease: Secondary | ICD-10-CM | POA: Diagnosis not present

## 2021-11-22 DIAGNOSIS — Z01818 Encounter for other preprocedural examination: Secondary | ICD-10-CM

## 2021-11-22 DIAGNOSIS — I129 Hypertensive chronic kidney disease with stage 1 through stage 4 chronic kidney disease, or unspecified chronic kidney disease: Secondary | ICD-10-CM | POA: Insufficient documentation

## 2021-11-22 HISTORY — PX: TOTAL KNEE ARTHROPLASTY: SHX125

## 2021-11-22 LAB — GLUCOSE, CAPILLARY
Glucose-Capillary: 114 mg/dL — ABNORMAL HIGH (ref 70–99)
Glucose-Capillary: 135 mg/dL — ABNORMAL HIGH (ref 70–99)

## 2021-11-22 SURGERY — ARTHROPLASTY, KNEE, TOTAL
Anesthesia: Spinal | Site: Knee | Laterality: Right

## 2021-11-22 MED ORDER — CHLORHEXIDINE GLUCONATE 0.12 % MT SOLN
15.0000 mL | Freq: Once | OROMUCOSAL | Status: AC
  Administered 2021-11-22: 15 mL via OROMUCOSAL

## 2021-11-22 MED ORDER — PROMETHAZINE HCL 25 MG/ML IJ SOLN: 6.2500 mg | INTRAMUSCULAR | Status: DC | PRN

## 2021-11-22 MED ORDER — DEXAMETHASONE SODIUM PHOSPHATE 10 MG/ML IJ SOLN
INTRAMUSCULAR | Status: DC | PRN
  Administered 2021-11-22: 10 mg via INTRAVENOUS

## 2021-11-22 MED ORDER — METHOCARBAMOL 500 MG PO TABS: 500.0000 mg | ORAL_TABLET | Freq: Three times a day (TID) | ORAL | 1 refills | Status: AC | PRN

## 2021-11-22 MED ORDER — ACETAMINOPHEN 500 MG PO TABS
1000.0000 mg | ORAL_TABLET | Freq: Once | ORAL | Status: AC
  Administered 2021-11-22: 1000 mg via ORAL
  Filled 2021-11-22: qty 2

## 2021-11-22 MED ORDER — OXYCODONE HCL 5 MG PO TABS: 5.0000 mg | ORAL_TABLET | ORAL | 0 refills | Status: AC | PRN

## 2021-11-22 MED ORDER — ORAL CARE MOUTH RINSE: 15.0000 mL | Freq: Once | OROMUCOSAL | Status: AC

## 2021-11-22 MED ORDER — SODIUM CHLORIDE (PF) 0.9 % IJ SOLN
INTRAMUSCULAR | Status: AC
  Filled 2021-11-22: qty 30

## 2021-11-22 MED ORDER — FENTANYL CITRATE PF 50 MCG/ML IJ SOSY: 25.0000 ug | PREFILLED_SYRINGE | INTRAMUSCULAR | Status: DC | PRN

## 2021-11-22 MED ORDER — PROPOFOL 10 MG/ML IV BOLUS
INTRAVENOUS | Status: DC | PRN
  Administered 2021-11-22: 30 mg via INTRAVENOUS

## 2021-11-22 MED ORDER — METHOCARBAMOL 500 MG IVPB - SIMPLE MED: 500.0000 mg | Freq: Four times a day (QID) | INTRAVENOUS | Status: DC | PRN

## 2021-11-22 MED ORDER — TRANEXAMIC ACID-NACL 1000-0.7 MG/100ML-% IV SOLN
INTRAVENOUS | Status: AC
  Filled 2021-11-22: qty 100

## 2021-11-22 MED ORDER — ONDANSETRON HCL 4 MG PO TABS: 4.0000 mg | ORAL_TABLET | Freq: Three times a day (TID) | ORAL | 1 refills | Status: AC | PRN

## 2021-11-22 MED ORDER — MIDAZOLAM HCL 2 MG/2ML IJ SOLN
2.0000 mg | INTRAMUSCULAR | Status: DC
  Administered 2021-11-22: 2 mg via INTRAVENOUS
  Filled 2021-11-22: qty 2

## 2021-11-22 MED ORDER — BUPIVACAINE LIPOSOME 1.3 % IJ SUSP
INTRAMUSCULAR | Status: AC
  Filled 2021-11-22: qty 20

## 2021-11-22 MED ORDER — AMISULPRIDE (ANTIEMETIC) 5 MG/2ML IV SOLN: 10.0000 mg | Freq: Once | INTRAVENOUS | Status: DC | PRN

## 2021-11-22 MED ORDER — LACTATED RINGERS IV SOLN: INTRAVENOUS | Status: DC

## 2021-11-22 MED ORDER — ASPIRIN 81 MG PO TBEC: 81.0000 mg | DELAYED_RELEASE_TABLET | Freq: Two times a day (BID) | ORAL | 0 refills | Status: AC

## 2021-11-22 MED ORDER — POVIDONE-IODINE 10 % EX SWAB: 2.0000 | Freq: Once | CUTANEOUS | Status: DC

## 2021-11-22 MED ORDER — WATER FOR IRRIGATION, STERILE IR SOLN
Status: DC | PRN
  Administered 2021-11-22: 2000 mL

## 2021-11-22 MED ORDER — CEFAZOLIN SODIUM-DEXTROSE 2-4 GM/100ML-% IV SOLN: 2.0000 g | Freq: Four times a day (QID) | INTRAVENOUS | Status: DC

## 2021-11-22 MED ORDER — FENTANYL CITRATE PF 50 MCG/ML IJ SOSY
100.0000 ug | PREFILLED_SYRINGE | INTRAMUSCULAR | Status: DC
  Administered 2021-11-22: 100 ug via INTRAVENOUS
  Filled 2021-11-22: qty 2

## 2021-11-22 MED ORDER — PROPOFOL 500 MG/50ML IV EMUL
INTRAVENOUS | Status: DC | PRN
  Administered 2021-11-22: 100 ug/kg/min via INTRAVENOUS

## 2021-11-22 MED ORDER — SODIUM CHLORIDE (PF) 0.9 % IJ SOLN
INTRAMUSCULAR | Status: DC | PRN
  Administered 2021-11-22: 30 mL

## 2021-11-22 MED ORDER — ONDANSETRON HCL 4 MG/2ML IJ SOLN
INTRAMUSCULAR | Status: DC | PRN
  Administered 2021-11-22: 4 mg via INTRAVENOUS

## 2021-11-22 MED ORDER — SODIUM CHLORIDE 0.9 % IR SOLN
Status: DC | PRN
  Administered 2021-11-22: 1000 mL

## 2021-11-22 MED ORDER — TRANEXAMIC ACID-NACL 1000-0.7 MG/100ML-% IV SOLN
1000.0000 mg | Freq: Once | INTRAVENOUS | Status: AC
  Administered 2021-11-22: 1000 mg via INTRAVENOUS

## 2021-11-22 MED ORDER — TRANEXAMIC ACID-NACL 1000-0.7 MG/100ML-% IV SOLN
1000.0000 mg | INTRAVENOUS | Status: AC
  Administered 2021-11-22: 1000 mg via INTRAVENOUS
  Filled 2021-11-22: qty 100

## 2021-11-22 MED ORDER — METHOCARBAMOL 500 MG PO TABS: 500.0000 mg | ORAL_TABLET | Freq: Four times a day (QID) | ORAL | Status: DC | PRN

## 2021-11-22 MED ORDER — BUPIVACAINE LIPOSOME 1.3 % IJ SUSP
INTRAMUSCULAR | Status: DC | PRN
  Administered 2021-11-22: 20 mL

## 2021-11-22 MED ORDER — BUPIVACAINE IN DEXTROSE 0.75-8.25 % IT SOLN
INTRATHECAL | Status: DC | PRN
  Administered 2021-11-22: 1.8 mL via INTRATHECAL

## 2021-11-22 MED ORDER — 0.9 % SODIUM CHLORIDE (POUR BTL) OPTIME
TOPICAL | Status: DC | PRN
  Administered 2021-11-22: 1000 mL

## 2021-11-22 MED ORDER — BUPIVACAINE-EPINEPHRINE 0.25% -1:200000 IJ SOLN
INTRAMUSCULAR | Status: DC | PRN
  Administered 2021-11-22: 30 mL

## 2021-11-22 MED ORDER — CEFAZOLIN SODIUM-DEXTROSE 2-4 GM/100ML-% IV SOLN
2.0000 g | INTRAVENOUS | Status: AC
  Administered 2021-11-22: 2 g via INTRAVENOUS
  Filled 2021-11-22: qty 100

## 2021-11-22 MED ORDER — BUPIVACAINE LIPOSOME 1.3 % IJ SUSP: 20.0000 mL | Freq: Once | INTRAMUSCULAR | Status: DC

## 2021-11-22 MED ORDER — PHENYLEPHRINE HCL-NACL 20-0.9 MG/250ML-% IV SOLN
INTRAVENOUS | Status: DC | PRN
  Administered 2021-11-22: 40 ug/min via INTRAVENOUS

## 2021-11-22 MED ORDER — CELECOXIB 200 MG PO CAPS
200.0000 mg | ORAL_CAPSULE | Freq: Once | ORAL | Status: AC
  Administered 2021-11-22: 200 mg via ORAL
  Filled 2021-11-22: qty 1

## 2021-11-22 SURGICAL SUPPLY — 49 items
ATTUNE MED DOME PAT 38 KNEE (Knees) IMPLANT
ATTUNE PSFEM RTSZ6 NARCEM KNEE (Femur) IMPLANT
ATTUNE PSRP INSR SZ6 8 KNEE (Insert) IMPLANT
BAG COUNTER SPONGE SURGICOUNT (BAG) IMPLANT
BAG ZIPLOCK 12X15 (MISCELLANEOUS) IMPLANT
BASE TIBIA ATTUNE KNEE SYS SZ6 (Knees) IMPLANT
BLADE SAG 18X100X1.27 (BLADE) ×1 IMPLANT
BLADE SAW SGTL 13X75X1.27 (BLADE) ×1 IMPLANT
BNDG ELASTIC 6X10 VLCR STRL LF (GAUZE/BANDAGES/DRESSINGS) ×1 IMPLANT
BNDG GAUZE DERMACEA FLUFF 4 (GAUZE/BANDAGES/DRESSINGS) ×1 IMPLANT
BOWL SMART MIX CTS (DISPOSABLE) ×1 IMPLANT
CEMENT HV SMART SET (Cement) ×2 IMPLANT
COVER SURGICAL LIGHT HANDLE (MISCELLANEOUS) ×1 IMPLANT
CUFF TOURN SGL QUICK 34 (TOURNIQUET CUFF) ×1
CUFF TRNQT CYL 34X4.125X (TOURNIQUET CUFF) ×1 IMPLANT
DRAPE INCISE IOBAN 66X45 STRL (DRAPES) ×1 IMPLANT
DRAPE SHEET LG 3/4 BI-LAMINATE (DRAPES) ×1 IMPLANT
DRAPE U-SHAPE 47X51 STRL (DRAPES) ×1 IMPLANT
DRSG ADAPTIC 3X8 NADH LF (GAUZE/BANDAGES/DRESSINGS) ×1 IMPLANT
DURAPREP 26ML APPLICATOR (WOUND CARE) ×1 IMPLANT
ELECT REM PT RETURN 15FT ADLT (MISCELLANEOUS) ×1 IMPLANT
GAUZE PAD ABD 8X10 STRL (GAUZE/BANDAGES/DRESSINGS) ×1 IMPLANT
GAUZE SPONGE 4X4 12PLY STRL (GAUZE/BANDAGES/DRESSINGS) ×1 IMPLANT
GLOVE BIOGEL PI IND STRL 7.5 (GLOVE) ×1 IMPLANT
GLOVE BIOGEL PI IND STRL 8.5 (GLOVE) ×1 IMPLANT
GLOVE ORTHO TXT STRL SZ7.5 (GLOVE) ×1 IMPLANT
GLOVE SURG ORTHO 8.5 STRL (GLOVE) ×2 IMPLANT
GOWN STRL REUS W/ TWL XL LVL3 (GOWN DISPOSABLE) ×2 IMPLANT
GOWN STRL REUS W/TWL XL LVL3 (GOWN DISPOSABLE) ×2
HANDPIECE INTERPULSE COAX TIP (DISPOSABLE) ×1
HOLDER FOLEY CATH W/STRAP (MISCELLANEOUS) IMPLANT
IMMOBILIZER KNEE 20 (SOFTGOODS) ×1
IMMOBILIZER KNEE 20 THIGH 36 (SOFTGOODS) IMPLANT
KIT TURNOVER KIT A (KITS) IMPLANT
MANIFOLD NEPTUNE II (INSTRUMENTS) ×1 IMPLANT
NS IRRIG 1000ML POUR BTL (IV SOLUTION) ×1 IMPLANT
PACK TOTAL KNEE CUSTOM (KITS) ×1 IMPLANT
PROTECTOR NERVE ULNAR (MISCELLANEOUS) ×1 IMPLANT
SET HNDPC FAN SPRY TIP SCT (DISPOSABLE) ×1 IMPLANT
STAPLER VISISTAT 35W (STAPLE) IMPLANT
STRIP CLOSURE SKIN 1/2X4 (GAUZE/BANDAGES/DRESSINGS) ×2 IMPLANT
SUT MNCRL AB 3-0 PS2 18 (SUTURE) ×1 IMPLANT
SUT VIC AB 0 CT1 36 (SUTURE) ×1 IMPLANT
SUT VIC AB 1 CT1 36 (SUTURE) ×3 IMPLANT
SUT VIC AB 2-0 CT1 27 (SUTURE) ×1
SUT VIC AB 2-0 CT1 TAPERPNT 27 (SUTURE) ×1 IMPLANT
TIBIA ATTUNE KNEE SYS BASE SZ6 (Knees) ×1 IMPLANT
WATER STERILE IRR 1000ML POUR (IV SOLUTION) ×2 IMPLANT
WRAP KNEE MAXI GEL POST OP (GAUZE/BANDAGES/DRESSINGS) IMPLANT

## 2021-11-22 NOTE — Transfer of Care (Signed)
Immediate Anesthesia Transfer of Care Note  Patient: Sara Parrish  Procedure(s) Performed: TOTAL KNEE ARTHROPLASTY (Right: Knee)  Patient Location: PACU  Anesthesia Type:Spinal  Level of Consciousness: awake, alert , and oriented  Airway & Oxygen Therapy: Patient Spontanous Breathing  Post-op Assessment: Report given to RN and Post -op Vital signs reviewed and stable  Post vital signs: Reviewed and stable  Last Vitals:  Vitals Value Taken Time  BP 115/68 11/22/21 1140  Temp    Pulse 73 11/22/21 1142  Resp 16 11/22/21 1142  SpO2 95 % 11/22/21 1142  Vitals shown include unvalidated device data.  Last Pain:  Vitals:   11/22/21 0916  TempSrc: Oral  PainSc: 0-No pain         Complications: No notable events documented.

## 2021-11-22 NOTE — Anesthesia Procedure Notes (Signed)
Spinal  Patient location during procedure: OR Start time: 11/22/2021 9:52 AM End time: 11/22/2021 9:55 AM Reason for block: surgical anesthesia Staffing Performed: resident/CRNA  Resident/CRNA: [place] Percy Comp C, CRNA Performed by: [place] Axxel Gude C, CRNA Authorized by: Duane Boston, MD   Preanesthetic Checklist Completed: patient identified, IV checked, site marked, risks and benefits discussed, surgical consent, monitors and equipment checked, pre-op evaluation and timeout performed Spinal Block Patient position: sitting Prep: DuraPrep and site prepped and draped Patient monitoring: heart rate, cardiac monitor, continuous pulse ox and blood pressure Approach: left paramedian Location: L3-4 Injection technique: single-shot Needle Needle type: Pencan  Needle gauge: 24 G Needle length: 9 cm Assessment Sensory level: T4 Events: CSF return Additional Notes IV functioning, monitors applied to pt. Expiration date of kit checked and confirmed to be in date. Sterile prep and drape, hand hygiene and sterile gloved used. Pt was positioned and spine was prepped in sterile fashion. Skin was anesthetized with lidocaine. Free flow of clear CSF obtained prior to injecting local anesthetic into CSF x 1 attempt. Spinal needle aspirated freely following injection. Needle was carefully withdrawn, and pt tolerated procedure well. Loss of motor and sensory on exam post injection.

## 2021-11-22 NOTE — Anesthesia Postprocedure Evaluation (Signed)
Anesthesia Post Note  Patient: Sara Parrish  Procedure(s) Performed: TOTAL KNEE ARTHROPLASTY (Right: Knee)     Patient location during evaluation: PACU Anesthesia Type: Spinal and MAC Level of consciousness: awake and alert Pain management: pain level controlled Vital Signs Assessment: post-procedure vital signs reviewed and stable Respiratory status: spontaneous breathing and respiratory function stable Cardiovascular status: blood pressure returned to baseline and stable Postop Assessment: spinal receding Anesthetic complications: no  No notable events documented.  Last Vitals:  Vitals:   11/22/21 1230 11/22/21 1245  BP: 111/69 113/75  Pulse: 81   Resp: 15 20  Temp: (!) 36.1 C (!) 36.1 C  SpO2: 98%     Last Pain:  Vitals:   11/22/21 1245  TempSrc:   PainSc: 0-No pain                 Ananda Sitzer DANIEL

## 2021-11-22 NOTE — Op Note (Unsigned)
Sara Parrish, HANISCH MEDICAL RECORD NO: [id number] ACCOUNT NO: [id number] DATE OF BIRTH: 16-Nov-1965 FACILITY: [facility] LOCATION: [facility] PHYSICIAN: Doran Heater. Veverly Fells, MD  Operative Report   DATE OF PROCEDURE: 11/22/2021  PREOPERATIVE DIAGNOSIS:  Right knee end-stage arthritis.  POSTOPERATIVE DIAGNOSIS:  Right knee end-stage arthritis.  PROCEDURE PERFORMED:  Right total knee arthroplasty using DePuy Attune prosthesis.  ATTENDING SURGEON:  Doran Heater. Veverly Fells, MD  ASSISTANT:  Charletta Cousin [place], [place], who was scrubbed during the entire procedure, and necessary for satisfactory completion of surgery.  ANESTHESIA:  Spinal anesthesia plus adductor canal block was used.  ESTIMATED BLOOD LOSS:  Minimal.  FLUID REPLACEMENT:  1500 mL crystalloid.  COUNTS:  Instrument counts correct.  COMPLICATIONS:  No complications.  ANTIBIOTICS:  Perioperative antibiotics were given.  TOURNIQUET TIME:  80 minutes at 300 mmHg.  INDICATIONS:  The patient is a 56 year old female who presents with worsening right knee pain secondary to bone-on-bone end-stage arthritis.  The patient has failed an extended period of conservative management and desires operative treatment to relieve  pain and restore function.  Informed consent obtained.  DESCRIPTION OF PROCEDURE:  After an adequate level of spinal anesthesia was achieved, the patient positioned supine on the operating room table.  Right leg correctly identified and nonsterile tourniquet placed on right proximal thigh.  The left leg was  padded appropriately and secured to the OR table.  After sterile prep and drape of the right leg and knee we performed our timeout verifying correct patient, correct site.  We then elevated the leg and exsanguinated with an Esmarch bandage, inflating the  tourniquet to 300 mmHg.  We performed a longitudinal midline incision with a 10 blade scalpel.  We used a fresh 10 blade for the parapatellar arthrotomy medially.  We then divided  lateral patellofemoral ligaments everting the patella.  We exposed the  distal femur, which was devoid of cartilage and eburnated bone was noted.  We entered the distal femur with a step cut drill placed our intramedullary guide and resected 10 mm off the distal femur set on 5 degrees valgus as the patient had a flexion  contracture.  We then sized the femur to a size 6 anterior down and performed our anterior, posterior and chamfer cuts off the 4-in-1 block.  Next, we removed ACL and PCL meniscal tissue subluxing the tibia anteriorly and performing our tibial cut using  an external jig with a 90 degree perpendicular cut, taking 2 mm off the affected medial side.  We used a minimal posterior slope for this posterior cruciate substituting prosthesis.  Once our tibial cut was done, we sized the tibia to size 6.  We then  did our modular drill and keel punch for final tibial preparation and then put our trial tibia in place.  Next, we went ahead and went to the femur and did our box cut for the 6 right narrow femur.  Once we had our box cut done, we placed our trial 6  right femur in place, we drilled our lug holes.  Next, we placed a 6 mm spacer in place and ranged the knee.  We had excellent soft tissue balance and we felt like we probably get a 7.  We then resurfaced the patella going from a 25 mm thickness down to  15 mm thickness.  We drilled lug holes for the 38 patellar button and then trialled the patellar button in place, we ranged the knee and had excellent patellar tracking with no-touch technique.  We  removed all trial components.  We irrigated thoroughly,  we vacuum mixed high viscosity cement on the back table and dried the bone well. We cemented the components into place tibia, femur and patella, all in one step.  We used a patellar clamp while the cement was setting up. We also used a 7 mm trial poly  and placed the knee in extension and held that well with good compression in extension while  the cement set up.  Once all the cement was hard, we removed excess cement with 1/4 inch curved osteotome.  We inspected the posterior aspect of the knee.  I did  inject the posterior capsule with Marcaine, Exparel and saline and also the anterior capsule with Marcaine, Exparel and saline during the surgery.  At this point, after thorough irrigation, we selected the real size 6, 8 mm poly and placed on the tibial  tray and reduced the knee, had nice little pop as the medial condyle reduced.  We irrigated thoroughly again and then closed the parapatellar arthrotomy with #1 Vicryl suture, followed by 0 in 2 layered subcutaneous closure and 4-0 Monocryl for skin.   Steri-Strips applied followed by sterile dressing.  The patient tolerated surgery well.   PUS D: 11/22/2021 11:39:38 am T: 11/22/2021 6:12:00 pm  JOB: [id number]/ [id number]

## 2021-11-22 NOTE — Interval H&P Note (Signed)
History and Physical Interval Note:  11/22/2021 9:36 AM  Sara Parrish  has presented today for surgery, with the diagnosis of Right knee end stage osteoarthritis.  The various methods of treatment have been discussed with the patient and family. After consideration of risks, benefits and other options for treatment, the patient has consented to  Procedure(s): TOTAL KNEE ARTHROPLASTY (Right) as a surgical intervention.  The patient's history has been reviewed, patient examined, no change in status, stable for surgery.  I have reviewed the patient's chart and labs.  Questions were answered to the patient's satisfaction.     Augustin Schooling

## 2021-11-22 NOTE — Brief Op Note (Signed)
11/22/2021  11:34 AM  PATIENT:  Sara Parrish  56 y.o. female  PRE-OPERATIVE DIAGNOSIS:  Right knee end stage osteoarthritis  POST-OPERATIVE DIAGNOSIS:  Right knee end stage osteoarthritis  PROCEDURE:  Procedure(s): TOTAL KNEE ARTHROPLASTY (Right) DePuy Attune  SURGEON:  Surgeon(s) and Role:    Netta Cedars, MD - Primary  PHYSICIAN ASSISTANT:   ASSISTANTS: [place] Bruns, PA-C   ANESTHESIA:   regional and spinal  EBL:  minimal   BLOOD ADMINISTERED:none  DRAINS: none   LOCAL MEDICATIONS USED:  MARCAINE     SPECIMEN:  No Specimen  DISPOSITION OF SPECIMEN:  N/A  COUNTS:  YES  TOURNIQUET:   Total Tourniquet Time Documented: Thigh (Right) - 80 minutes Total: Thigh (Right) - 80 minutes   DICTATION: .Other Dictation: Dictation Number [id number]  PLAN OF CARE: Discharge to home after PACU  PATIENT DISPOSITION:  PACU - hemodynamically stable.   Delay start of Pharmacological VTE agent (>24hrs) due to surgical blood loss or risk of bleeding: no

## 2021-11-22 NOTE — Progress Notes (Signed)
Orthopedic Tech Progress Note Patient Details:  Sara Parrish 1965/12/14 [id number]  Ortho Devices Type of Ortho Device: Bone foam zero knee Ortho Device/Splint Location: RLE Ortho Device/Splint Interventions: Application   Post Interventions Patient Tolerated: Well  Sara Parrish 11/22/2021, 1:26 PM

## 2021-11-22 NOTE — Discharge Instructions (Signed)
Ice to the knee constantly.  Keep the incision covered and clean and dry for one week, then ok to get it wet in the shower. Please remove all dressings at one week and leave open to air.  Do exercise as instructed every hour, please to prevent stiffness.      DO NOT prop anything under the knee, it will make your knee stiff.  Prop under the ankle to encourage your knee to go straight.   Use the walker while you are up and around for balance.  Wear your support stockings 24/7 to prevent blood clots and take baby aspirin twice daily for 30 days also to prevent blood clots  Follow up with Dr Veverly Fells in two weeks in the office, call [phone] for appt  Please call Dr Veverly Fells (cell) [phone] with any questions or concerns  INSTRUCTIONS AFTER JOINT REPLACEMENT   Remove items at home which could result in a fall. This includes throw rugs or furniture in walking pathways ICE to the affected joint every three hours while awake for 30 minutes at a time, for at least the first 3-5 days, and then as needed for pain and swelling.  Continue to use ice for pain and swelling. You may notice swelling that will progress down to the foot and ankle.  This is normal after surgery.  Elevate your leg when you are not up walking on it.   Continue to use the breathing machine you got in the hospital (incentive spirometer) which will help keep your temperature down.  It is common for your temperature to cycle up and down following surgery, especially at night when you are not up moving around and exerting yourself.  The breathing machine keeps your lungs expanded and your temperature down.   DIET:  As you were doing prior to hospitalization, we recommend a well-balanced diet.  DRESSING / WOUND CARE / SHOWERING  You may change your dressing 3-5 days after surgery.  Then change the dressing every day with sterile gauze.  Please use good hand washing techniques before changing the dressing.  Do not use any lotions or  creams on the incision until instructed by your surgeon.  ACTIVITY  Increase activity slowly as tolerated, but follow the weight bearing instructions below.   No driving for 6 weeks or until further direction given by your physician.  You cannot drive while taking narcotics.  No lifting or carrying greater than 10 lbs. until further directed by your surgeon. Avoid periods of inactivity such as sitting longer than an hour when not asleep. This helps prevent blood clots.  You may return to work once you are authorized by your doctor.     WEIGHT BEARING   Weight bearing as tolerated with assist device (walker, cane, etc) as directed, use it as long as suggested by your surgeon or therapist, typically at least 4-6 weeks.   EXERCISES  Results after joint replacement surgery are often greatly improved when you follow the exercise, range of motion and muscle strengthening exercises prescribed by your doctor. Safety measures are also important to protect the joint from further injury. Any time any of these exercises cause you to have increased pain or swelling, decrease what you are doing until you are comfortable again and then slowly increase them. If you have problems or questions, call your caregiver or physical therapist for advice.   Rehabilitation is important following a joint replacement. After just a few days of immobilization, the muscles of the leg  can become weakened and shrink (atrophy).  These exercises are designed to build up the tone and strength of the thigh and leg muscles and to improve motion. Often times heat used for twenty to thirty minutes before working out will loosen up your tissues and help with improving the range of motion but do not use heat for the first two weeks following surgery (sometimes heat can increase post-operative swelling).   These exercises can be done on a training (exercise) mat, on the floor, on a table or on a bed. Use whatever works the best and is  most comfortable for you.    Use music or television while you are exercising so that the exercises are a pleasant break in your day. This will make your life better with the exercises acting as a break in your routine that you can look forward to.   Perform all exercises about fifteen times, three times per day or as directed.  You should exercise both the operative leg and the other leg as well.  Exercises include:   Quad Sets - Tighten up the muscle on the front of the thigh (Quad) and hold for 5-10 seconds.   Straight Leg Raises - With your knee straight (if you were given a brace, keep it on), lift the leg to 60 degrees, hold for 3 seconds, and slowly lower the leg.  Perform this exercise against resistance later as your leg gets stronger.  Leg Slides: Lying on your back, slowly slide your foot toward your buttocks, bending your knee up off the floor (only go as far as is comfortable). Then slowly slide your foot back down until your leg is flat on the floor again.  Angel Wings: Lying on your back spread your legs to the side as far apart as you can without causing discomfort.  Hamstring Strength:  Lying on your back, push your heel against the floor with your leg straight by tightening up the muscles of your buttocks.  Repeat, but this time bend your knee to a comfortable angle, and push your heel against the floor.  You may put a pillow under the heel to make it more comfortable if necessary.   A rehabilitation program following joint replacement surgery can speed recovery and prevent re-injury in the future due to weakened muscles. Contact your doctor or a physical therapist for more information on knee rehabilitation.    CONSTIPATION  Constipation is defined medically as fewer than three stools per week and severe constipation as less than one stool per week.  Even if you have a regular bowel pattern at home, your normal regimen is likely to be disrupted due to multiple reasons following  surgery.  Combination of anesthesia, postoperative narcotics, change in appetite and fluid intake all can affect your bowels.   YOU MUST use at least one of the following options; they are listed in order of increasing strength to get the job done.  They are all available over the counter, and you may need to use some, POSSIBLY even all of these options:    Drink plenty of fluids (prune juice may be helpful) and high fiber foods Colace 100 mg by mouth twice a day  Senokot for constipation as directed and as needed Dulcolax (bisacodyl), take with full glass of water  Miralax (polyethylene glycol) once or twice a day as needed.  If you have tried all these things and are unable to have a bowel movement in the first 3-4 days after surgery  call either your surgeon or your primary doctor.    If you experience loose stools or diarrhea, hold the medications until you stool forms back up.  If your symptoms do not get better within 1 week or if they get worse, check with your doctor.  If you experience "the worst abdominal pain ever" or develop nausea or vomiting, please contact the office immediately for further recommendations for treatment.   ITCHING:  If you experience itching with your medications, try taking only a single pain pill, or even half a pain pill at a time.  You can also use Benadryl over the counter for itching or also to help with sleep.   TED HOSE STOCKINGS:  Use stockings on both legs until for at least 2 weeks or as directed by physician office. They may be removed at night for sleeping.  MEDICATIONS:  See your medication summary on the "After Visit Summary" that nursing will review with you.  You may have some home medications which will be placed on hold until you complete the course of blood thinner medication.  It is important for you to complete the blood thinner medication as prescribed.  PRECAUTIONS:  If you experience chest pain or shortness of breath - call 911 immediately  for transfer to the hospital emergency department.   If you develop a fever greater that 101 F, purulent drainage from wound, increased redness or drainage from wound, foul odor from the wound/dressing, or calf pain - CONTACT YOUR SURGEON.                                                   FOLLOW-UP APPOINTMENTS:  If you do not already have a post-op appointment, please call the office for an appointment to be seen by your surgeon.  Guidelines for how soon to be seen are listed in your "After Visit Summary", but are typically between 1-4 weeks after surgery.  OTHER INSTRUCTIONS:   Knee Replacement:  Do not place pillow under knee, focus on keeping the knee straight while resting. CPM instructions: 0-90 degrees, 2 hours in the morning, 2 hours in the afternoon, and 2 hours in the evening. Place foam block, curve side up under heel at all times except when in CPM or when walking.  DO NOT modify, tear, cut, or change the foam block in any way.  POST-OPERATIVE OPIOID TAPER INSTRUCTIONS: It is important to wean off of your opioid medication as soon as possible. If you do not need pain medication after your surgery it is ok to stop day one. Opioids include: Codeine, Hydrocodone(Norco, Vicodin), Oxycodone(Percocet, oxycontin) and hydromorphone amongst others.  Long term and even short term use of opiods can cause: Increased pain response Dependence Constipation Depression Respiratory depression And more.  Withdrawal symptoms can include Flu like symptoms Nausea, vomiting And more Techniques to manage these symptoms Hydrate well Eat regular healthy meals Stay active Use relaxation techniques(deep breathing, meditating, yoga) Do Not substitute Alcohol to help with tapering If you have been on opioids for less than two weeks and do not have pain than it is ok to stop all together.  Plan to wean off of opioids This plan should start within one week post op of your joint replacement. Maintain  the same interval or time between taking each dose and first decrease the dose.  Cut the  total daily intake of opioids by one tablet each day Next start to increase the time between doses. The last dose that should be eliminated is the evening dose.   MAKE SURE YOU:  Understand these instructions.  Get help right away if you are not doing well or get worse.    Thank you for letting us be a part of your medical care team.  It is a privilege we respect greatly.  We hope these instructions will help you stay on track for a fast and full recovery!

## 2021-11-22 NOTE — Evaluation (Signed)
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 PMH: bipolar, CKD, depression, DM, GERD, HTN, tachycardia, R rotator cuff repair 03/01/2021.  Clinical Impression  Sara Parrish is a 56 y.o. female POD 0 s/p R-TKA. Patient reports independence with mobility at baseline. Patient is now limited by functional impairments (see PT problem list below) and requires min guard for transfers and gait with RW. Patient was able to ambulate 50 feet with RW and min guard and cues for safe walker management. Patient educated on safe sequencing for stair mobility and verbalized safe guarding position for people assisting with mobility. Patient instructed in exercises to facilitate ROM and circulation. Patient will benefit from continued skilled PT interventions to address impairments and progress towards PLOF. Patient has met mobility goals at adequate level for discharge home; will continue to follow if pt continues acute stay to progress towards Mod I goals.       Recommendations for follow up therapy are one component of a multi-disciplinary discharge planning process, led by the attending physician.  Recommendations may be updated based on patient status, additional functional criteria and insurance authorization.  Follow Up Recommendations Follow physician's recommendations for discharge plan and follow up therapies      Assistance Recommended at Discharge Frequent or constant Supervision/Assistance  Patient can return home with the following  A little help with walking and/or transfers;A little help with bathing/dressing/bathroom;Assistance with cooking/housework;Help with stairs or ramp for entrance;Assist for transportation    Equipment Recommendations Rolling walker (2 wheels)  Recommendations for Other Services       Functional Status Assessment Patient has  had a recent decline in their functional status and demonstrates the ability to make significant improvements in function in a reasonable and predictable amount of time.     Precautions / Restrictions Precautions Precautions: Fall;Knee Precaution Booklet Issued: No Precaution Comments: no pillow under the knee Required Braces or Orthoses: Knee Immobilizer - Right Knee Immobilizer - Right: On when out of bed or walking Restrictions Weight Bearing Restrictions: No Other Position/Activity Restrictions: wbat      Mobility  Bed Mobility Overal bed mobility: Needs Assistance Bed Mobility: Supine to Sit     Supine to sit: Supervision, HOB elevated     General bed mobility comments: Pt required supervision for safety, no physical assist required    Transfers Overall transfer level: Needs assistance Equipment used: Rolling walker (2 wheels) Transfers: Sit to/from Stand Sit to Stand: Min guard, From elevated surface           General transfer comment: Pt required min guard from stretcher, no physical assist required, VCs for hand placement and powering up via LLE and BUE.    Ambulation/Gait Ambulation/Gait assistance: Min guard Gait Distance (Feet): 50 Feet Assistive device: Rolling walker (2 wheels) Gait Pattern/deviations: WFL(Within Functional Limits) Gait velocity: decreased     General Gait Details: Pt ambulated with RW and min guard, no physical assist required or overt LOB noted.  Stairs Stairs: Yes Stairs assistance: Min guard Stair Management: One rail Right, Step to pattern, Sideways Number of Stairs: 2 General stair comments: Pt completed sideways stair mobility with min guard, VCs for sequencing and hand placement, no physical assist required or overt LOB noted. Handout provided  Wheelchair Mobility    Modified Rankin (Stroke Patients Only)       Balance Overall balance assessment: Needs assistance Sitting-balance support:  Feet supported, No upper  extremity supported       Standing balance support: Reliant on assistive device for balance, During functional activity, Bilateral upper extremity supported Standing balance-Leahy Scale: Poor                               Pertinent Vitals/Pain Pain Assessment Pain Assessment: No/denies pain    Home Living Family/patient expects to be discharged to:: Private residence Living Arrangements: Spouse/significant other Available Help at Discharge: Family;Available 24 hours/day Type of Home: House Home Access: Stairs to enter Entrance Stairs-Rails: Psychiatric nurse of Steps: 3   Home Layout: Able to live on main level with bedroom/bathroom Home Equipment: Shower seat;Grab bars - tub/shower      Prior Function Prior Level of Function : Independent/Modified Independent;Driving             Mobility Comments: IND ADLs Comments: IND     Hand Dominance   Dominant Hand: Right    Extremity/Trunk Assessment   Upper Extremity Assessment Upper Extremity Assessment: Overall WFL for tasks assessed    Lower Extremity Assessment Lower Extremity Assessment: RLE deficits/detail;LLE deficits/detail RLE Deficits / Details: MMT ank DF/PF 5/5, NO EXTENSOR LAG NOTED RLE Sensation: WNL LLE Deficits / Details: MMT ank DF/PF 5/5 LLE Sensation: WNL    Cervical / Trunk Assessment Cervical / Trunk Assessment: Normal  Communication   Communication: No difficulties  Cognition Arousal/Alertness: Awake/alert Behavior During Therapy: WFL for tasks assessed/performed Overall Cognitive Status: Within Functional Limits for tasks assessed                                          General Comments General comments (skin integrity, edema, etc.): Husband Thayer Jew present    Exercises Total Joint Exercises Ankle Circles/Pumps: AROM, Both, 10 reps Quad Sets: AROM, Right, Other reps (comment) (2) Short Arc Quad: AROM, Right, Other reps (comment)  (2) Heel Slides: AROM, Right, Other reps (comment) (2) Hip ABduction/ADduction: AROM, Right, 5 reps Straight Leg Raises: AROM, Right, Other reps (comment) (2) Goniometric ROM: -5-60deg by gross visual approximation   Assessment/Plan    PT Assessment Patient needs continued PT services  PT Problem List Decreased strength;Decreased range of motion;Decreased activity tolerance;Decreased balance;Decreased mobility;Decreased coordination;Pain       PT Treatment Interventions DME instruction;Gait training;Stair training;Functional mobility training;Therapeutic activities;Therapeutic exercise;Balance training;Neuromuscular re-education;Patient/family education    PT Goals (Current goals can be found in the Care Plan section)  Acute Rehab PT Goals Patient Stated Goal: Play with granddaughter PT Goal Formulation: With patient Time For Goal Achievement: 11/29/21 Potential to Achieve Goals: Good    Frequency 7X/week     Co-evaluation               AM-PAC PT "6 Clicks" Mobility  Outcome Measure Help needed turning from your back to your side while in a flat bed without using bedrails?: None Help needed moving from lying on your back to sitting on the side of a flat bed without using bedrails?: A Little Help needed moving to and from a bed to a chair (including a wheelchair)?: A Little Help needed standing up from a chair using your arms (e.g., wheelchair or bedside chair)?: A Little Help needed to walk in hospital room?: A Little Help needed climbing 3-5 steps with a railing? : A Little 6 Click Score: 19  End of Session Equipment Utilized During Treatment: Gait belt;Right knee immobilizer Activity Tolerance: Patient tolerated treatment well;No increased pain Patient left: in chair;with call bell/phone within reach;with family/visitor present Nurse Communication: Mobility status PT Visit Diagnosis: Pain;Difficulty in walking, not elsewhere classified (R26.2) Pain - Right/Left:  Right Pain - part of body: Knee    Time: 0923-3007 PT Time Calculation (min) (ACUTE ONLY): 45 min   Charges:   PT Evaluation $PT Eval Low Complexity: 1 Low PT Treatments $Gait Training: 8-22 mins $Therapeutic Exercise: 8-22 mins        Coolidge Breeze, PT, DPT [facility] Department Office: [phone] Weekend pager: [phone]  Coolidge Breeze 11/22/2021, 3:00 PM

## 2021-11-25 ENCOUNTER — Encounter (HOSPITAL_COMMUNITY): Payer: Self-pay | Admitting: Orthopedic Surgery

## 2021-12-11 MED ORDER — DEXAMETHASONE SODIUM PHOSPHATE 10 MG/ML IJ SOLN
INTRAMUSCULAR | Status: DC | PRN
  Administered 2021-11-22: 5 mg

## 2021-12-11 MED ORDER — ROPIVACAINE HCL 7.5 MG/ML IJ SOLN
INTRAMUSCULAR | Status: DC | PRN
  Administered 2021-11-22: 20 mL via PERINEURAL

## 2021-12-11 NOTE — Addendum Note (Signed)
Addendum  created 12/11/21 1805 by Duane Boston, MD   Child order released for a procedure order, Clinical Note Signed, Intraprocedure Blocks edited, Intraprocedure Meds edited, SmartForm saved

## 2021-12-11 NOTE — Anesthesia Procedure Notes (Signed)
Anesthesia Regional Block: Adductor canal block   Pre-Anesthetic Checklist: , timeout performed,  Correct Patient, Correct Site, Correct Laterality,  Correct Procedure, Correct Position, site marked,  Risks and benefits discussed,  Surgical consent,  Pre-op evaluation,  At surgeon's request and post-op pain management  Laterality: Right  Prep: chloraprep       Needles:  Injection technique: Single-shot  Needle Type: Stimulator Needle - 80     Needle Length: 10cm  Needle Gauge: 21     Additional Needles:   Narrative:  Start time: 11/22/2021 9:08 AM End time: 11/22/2021 9:18 AM Injection made incrementally with aspirations every 5 mL.  Performed by: Personally  Anesthesiologist: Duane Boston, MD
